# Patient Record
Sex: Male | Born: 1955 | Race: White | Hispanic: No | Marital: Single | Smoking: Never smoker
Health system: Southern US, Community
[De-identification: ages and names within clinical notes are randomized; demographics above are authoritative.]

## PROBLEM LIST (undated history)

## (undated) DIAGNOSIS — E119 Type 2 diabetes mellitus without complications: Secondary | ICD-10-CM

## (undated) DIAGNOSIS — E785 Hyperlipidemia, unspecified: Secondary | ICD-10-CM

## (undated) DIAGNOSIS — I10 Essential (primary) hypertension: Secondary | ICD-10-CM

## (undated) HISTORY — PX: COLONOSCOPY: SHX174

## (undated) HISTORY — PX: TONSILLECTOMY: SUR1361

---

## 2012-04-24 ENCOUNTER — Encounter (HOSPITAL_COMMUNITY): Payer: Self-pay | Admitting: *Deleted

## 2012-04-24 ENCOUNTER — Emergency Department (HOSPITAL_COMMUNITY): Payer: Self-pay

## 2012-04-24 ENCOUNTER — Inpatient Hospital Stay (HOSPITAL_COMMUNITY)
Admission: EM | Admit: 2012-04-24 | Discharge: 2012-04-25 | DRG: 552 | Disposition: A | Discharge status: Home / Self Care | Payer: Self-pay | Attending: General Surgery | Admitting: General Surgery

## 2012-04-24 DIAGNOSIS — S2241XA Multiple fractures of ribs, right side, initial encounter for closed fracture: Secondary | ICD-10-CM | POA: Diagnosis present

## 2012-04-24 DIAGNOSIS — S32599A Other specified fracture of unspecified pubis, initial encounter for closed fracture: Secondary | ICD-10-CM | POA: Diagnosis present

## 2012-04-24 DIAGNOSIS — W11XXXA Fall on and from ladder, initial encounter: Secondary | ICD-10-CM | POA: Diagnosis present

## 2012-04-24 DIAGNOSIS — S3210XB Unspecified fracture of sacrum, initial encounter for open fracture: Secondary | ICD-10-CM

## 2012-04-24 DIAGNOSIS — E669 Obesity, unspecified: Secondary | ICD-10-CM | POA: Diagnosis present

## 2012-04-24 DIAGNOSIS — E785 Hyperlipidemia, unspecified: Secondary | ICD-10-CM | POA: Insufficient documentation

## 2012-04-24 DIAGNOSIS — S322XXA Fracture of coccyx, initial encounter for closed fracture: Principal | ICD-10-CM | POA: Diagnosis present

## 2012-04-24 DIAGNOSIS — S329XXA Fracture of unspecified parts of lumbosacral spine and pelvis, initial encounter for closed fracture: Secondary | ICD-10-CM

## 2012-04-24 DIAGNOSIS — S2239XA Fracture of one rib, unspecified side, initial encounter for closed fracture: Secondary | ICD-10-CM

## 2012-04-24 DIAGNOSIS — S0180XA Unspecified open wound of other part of head, initial encounter: Secondary | ICD-10-CM | POA: Diagnosis present

## 2012-04-24 DIAGNOSIS — Y93H9 Activity, other involving exterior property and land maintenance, building and construction: Secondary | ICD-10-CM

## 2012-04-24 DIAGNOSIS — S0181XA Laceration without foreign body of other part of head, initial encounter: Secondary | ICD-10-CM | POA: Diagnosis present

## 2012-04-24 DIAGNOSIS — W19XXXA Unspecified fall, initial encounter: Secondary | ICD-10-CM

## 2012-04-24 DIAGNOSIS — S32009A Unspecified fracture of unspecified lumbar vertebra, initial encounter for closed fracture: Secondary | ICD-10-CM | POA: Diagnosis present

## 2012-04-24 DIAGNOSIS — S322XXB Fracture of coccyx, initial encounter for open fracture: Secondary | ICD-10-CM

## 2012-04-24 DIAGNOSIS — S3210XA Unspecified fracture of sacrum, initial encounter for closed fracture: Principal | ICD-10-CM | POA: Diagnosis present

## 2012-04-24 DIAGNOSIS — S32509A Unspecified fracture of unspecified pubis, initial encounter for closed fracture: Secondary | ICD-10-CM | POA: Diagnosis present

## 2012-04-24 DIAGNOSIS — Y92009 Unspecified place in unspecified non-institutional (private) residence as the place of occurrence of the external cause: Secondary | ICD-10-CM

## 2012-04-24 DIAGNOSIS — S2249XA Multiple fractures of ribs, unspecified side, initial encounter for closed fracture: Secondary | ICD-10-CM | POA: Diagnosis present

## 2012-04-24 HISTORY — DX: Hyperlipidemia, unspecified: E78.5

## 2012-04-24 LAB — DIFFERENTIAL
Basophils Absolute: 0 10*3/uL (ref 0.0–0.1)
Basophils Relative: 0 % (ref 0–1)
Eosinophils Absolute: 0 10*3/uL (ref 0.0–0.7)
Eosinophils Relative: 0 % (ref 0–5)
Lymphocytes Relative: 3 % — ABNORMAL LOW (ref 12–46)
Lymphs Abs: 0.6 10*3/uL — ABNORMAL LOW (ref 0.7–4.0)
Monocytes Absolute: 1.3 10*3/uL — ABNORMAL HIGH (ref 0.1–1.0)
Monocytes Relative: 8 % (ref 3–12)
Neutro Abs: 14.1 10*3/uL — ABNORMAL HIGH (ref 1.7–7.7)
Neutrophils Relative %: 88 % — ABNORMAL HIGH (ref 43–77)

## 2012-04-24 LAB — URINALYSIS, ROUTINE W REFLEX MICROSCOPIC
Bilirubin Urine: NEGATIVE
Glucose, UA: NEGATIVE mg/dL
Ketones, ur: NEGATIVE mg/dL
Leukocytes, UA: NEGATIVE
Nitrite: NEGATIVE
Protein, ur: NEGATIVE mg/dL
Specific Gravity, Urine: 1.02 (ref 1.005–1.030)
Urobilinogen, UA: 1 mg/dL (ref 0.0–1.0)
pH: 6 (ref 5.0–8.0)

## 2012-04-24 LAB — BASIC METABOLIC PANEL
BUN: 16 mg/dL (ref 6–23)
CO2: 24 mEq/L (ref 19–32)
Calcium: 9.3 mg/dL (ref 8.4–10.5)
Chloride: 105 mEq/L (ref 96–112)
Creatinine, Ser: 0.94 mg/dL (ref 0.50–1.35)
GFR calc Af Amer: >90 mL/min (ref >90)
GFR calc non Af Amer: >90 mL/min (ref >90)
Glucose, Bld: 177 mg/dL — ABNORMAL HIGH (ref 70–99)
Potassium: 4.9 mEq/L (ref 3.5–5.1)
Sodium: 139 mEq/L (ref 135–145)

## 2012-04-24 LAB — URINE MICROSCOPIC-ADD ON

## 2012-04-24 LAB — CBC
HCT: 40.1 % (ref 39.0–52.0)
Hemoglobin: 13.7 g/dL (ref 13.0–17.0)
MCH: 28.4 pg (ref 26.0–34.0)
MCHC: 34.2 g/dL (ref 30.0–36.0)
MCV: 83 fL (ref 78.0–100.0)
Platelets: 190 10*3/uL (ref 150–400)
RBC: 4.83 MIL/uL (ref 4.22–5.81)
RDW: 13.5 % (ref 11.5–15.5)
WBC: 16 10*3/uL — ABNORMAL HIGH (ref 4.0–10.5)

## 2012-04-24 LAB — TYPE AND SCREEN
ABO/RH(D): A POS
Antibody Screen: NEGATIVE

## 2012-04-24 LAB — ABO/RH: ABO/RH(D): A POS

## 2012-04-24 MED ORDER — ONDANSETRON HCL 4 MG/2ML IJ SOLN: 4.0000 mg | Freq: Four times a day (QID) | INTRAMUSCULAR | Status: DC | PRN

## 2012-04-24 MED ORDER — ENOXAPARIN SODIUM 30 MG/0.3ML ~~LOC~~ SOLN
30.0000 mg | Freq: Two times a day (BID) | SUBCUTANEOUS | Status: DC
  Administered 2012-04-24 – 2012-04-25 (×3): 30 mg via SUBCUTANEOUS
  Filled 2012-04-24 (×4): qty 0.3

## 2012-04-24 MED ORDER — MORPHINE SULFATE 4 MG/ML IJ SOLN
4.0000 mg | Freq: Once | INTRAMUSCULAR | Status: AC
  Administered 2012-04-24: 4 mg via INTRAVENOUS
  Filled 2012-04-24: qty 1

## 2012-04-24 MED ORDER — ONDANSETRON HCL 4 MG PO TABS: 4.0000 mg | ORAL_TABLET | Freq: Four times a day (QID) | ORAL | Status: DC | PRN

## 2012-04-24 MED ORDER — MORPHINE SULFATE 4 MG/ML IJ SOLN: 4.0000 mg | INTRAMUSCULAR | Status: DC | PRN

## 2012-04-24 MED ORDER — PRAVASTATIN SODIUM 20 MG PO TABS
20.0000 mg | ORAL_TABLET | Freq: Every day | ORAL | Status: DC
  Administered 2012-04-24: 20 mg via ORAL
  Filled 2012-04-24 (×2): qty 1

## 2012-04-24 MED ORDER — OXYCODONE HCL 5 MG PO TABS
5.0000 mg | ORAL_TABLET | ORAL | Status: DC | PRN
  Administered 2012-04-25: 5 mg via ORAL
  Filled 2012-04-24: qty 1

## 2012-04-24 MED ORDER — OXYCODONE HCL 5 MG PO TABS
2.5000 mg | ORAL_TABLET | ORAL | Status: DC | PRN
  Filled 2012-04-24: qty 1

## 2012-04-24 MED ORDER — IOHEXOL 300 MG/ML  SOLN
70.0000 mL | Freq: Once | INTRAMUSCULAR | Status: AC | PRN
  Administered 2012-04-24: 70 mL via INTRAVENOUS

## 2012-04-24 MED ORDER — GEMFIBROZIL 600 MG PO TABS
600.0000 mg | ORAL_TABLET | Freq: Two times a day (BID) | ORAL | Status: DC
  Administered 2012-04-24 – 2012-04-25 (×3): 600 mg via ORAL
  Filled 2012-04-24 (×5): qty 1

## 2012-04-24 MED ORDER — IOHEXOL 300 MG/ML  SOLN
80.0000 mL | Freq: Once | INTRAMUSCULAR | Status: AC | PRN
  Administered 2012-04-24: 80 mL via INTRAVENOUS

## 2012-04-24 MED ORDER — SODIUM CHLORIDE 0.45 % IV SOLN
INTRAVENOUS | Status: DC
  Administered 2012-04-24 – 2012-04-25 (×2): via INTRAVENOUS

## 2012-04-24 MED ORDER — SIMVASTATIN 10 MG PO TABS: 10.0000 mg | ORAL_TABLET | Freq: Every day | ORAL | Status: DC

## 2012-04-24 MED ORDER — POLYETHYLENE GLYCOL 3350 17 G PO PACK
17.0000 g | PACK | Freq: Every day | ORAL | Status: DC
  Filled 2012-04-24 (×2): qty 1

## 2012-04-24 MED ORDER — DOCUSATE SODIUM 100 MG PO CAPS
100.0000 mg | ORAL_CAPSULE | Freq: Two times a day (BID) | ORAL | Status: DC
  Administered 2012-04-24 – 2012-04-25 (×2): 100 mg via ORAL
  Filled 2012-04-24 (×2): qty 1

## 2012-04-24 MED ORDER — OXYCODONE HCL 5 MG PO TABS
10.0000 mg | ORAL_TABLET | ORAL | Status: DC | PRN
  Administered 2012-04-24 – 2012-04-25 (×4): 10 mg via ORAL
  Administered 2012-04-25: 5 mg via ORAL
  Filled 2012-04-24 (×4): qty 2

## 2012-04-24 NOTE — Consult Note (Signed)
Reason for Consult: Pelvic ring fracture on the right. Referring Physician: Trauma  Charles Brandt is an 56 y.o. male.  HPI: Patient is a 56 year old gentleman who was cutting tree limbs while standing on a ladder. He states it will and optimal off the ladder landed on his right hip. Patient denies any pain in the head and neck or lower or upper extremities complains of right rib pain and right pelvic pain.  Past Medical History  Diagnosis Date  . Hyperlipidemia     History reviewed. No pertinent past surgical history.  History reviewed. No pertinent family history.  Social History:  does not have a smoking history on file. He does not have any smokeless tobacco history on file. He reports that he drinks alcohol. He reports that he does not use illicit drugs.  Allergies: No Known Allergies  Medications: I have reviewed the patient's current medications.  Results for orders placed during the hospital encounter of 04/24/12 (from the past 48 hour(s))  CBC     Status: Abnormal   Collection Time   04/24/12  2:01 AM      Component Value Range Comment   WBC 16.0 (*) 4.0 - 10.5 (K/uL)    RBC 4.83  4.22 - 5.81 (MIL/uL)    Hemoglobin 13.7  13.0 - 17.0 (g/dL)    HCT 16.1  09.6 - 04.5 (%)    MCV 83.0  78.0 - 100.0 (fL)    MCH 28.4  26.0 - 34.0 (pg)    MCHC 34.2  30.0 - 36.0 (g/dL)    RDW 40.9  81.1 - 91.4 (%)    Platelets 190  150 - 400 (K/uL)   DIFFERENTIAL     Status: Abnormal   Collection Time   04/24/12  2:01 AM      Component Value Range Comment   Neutrophils Relative 88 (*) 43 - 77 (%)    Neutro Abs 14.1 (*) 1.7 - 7.7 (K/uL)    Lymphocytes Relative 3 (*) 12 - 46 (%)    Lymphs Abs 0.6 (*) 0.7 - 4.0 (K/uL)    Monocytes Relative 8  3 - 12 (%)    Monocytes Absolute 1.3 (*) 0.1 - 1.0 (K/uL)    Eosinophils Relative 0  0 - 5 (%)    Eosinophils Absolute 0.0  0.0 - 0.7 (K/uL)    Basophils Relative 0  0 - 1 (%)    Basophils Absolute 0.0  0.0 - 0.1 (K/uL)   BASIC METABOLIC PANEL     Status:  Abnormal   Collection Time   04/24/12  2:01 AM      Component Value Range Comment   Sodium 139  135 - 145 (mEq/L)    Potassium 4.9  3.5 - 5.1 (mEq/L) HEMOLYSIS AT THIS LEVEL MAY AFFECT RESULT   Chloride 105  96 - 112 (mEq/L)    CO2 24  19 - 32 (mEq/L)    Glucose, Bld 177 (*) 70 - 99 (mg/dL)    BUN 16  6 - 23 (mg/dL)    Creatinine, Ser 7.82  0.50 - 1.35 (mg/dL)    Calcium 9.3  8.4 - 10.5 (mg/dL)    GFR calc non Af Amer >90  >90 (mL/min)    GFR calc Af Amer >90  >90 (mL/min)   TYPE AND SCREEN     Status: Normal   Collection Time   04/24/12  4:00 AM      Component Value Range Comment   ABO/RH(D) A POS  Antibody Screen NEG      Sample Expiration 04/27/2012     ABO/RH     Status: Normal   Collection Time   04/24/12  4:00 AM      Component Value Range Comment   ABO/RH(D) A POS     URINALYSIS, ROUTINE W REFLEX MICROSCOPIC     Status: Abnormal   Collection Time   04/24/12  8:45 AM      Component Value Range Comment   Color, Urine YELLOW  YELLOW     APPearance CLEAR  CLEAR     Specific Gravity, Urine 1.020  1.005 - 1.030     pH 6.0  5.0 - 8.0     Glucose, UA NEGATIVE  NEGATIVE (mg/dL)    Hgb urine dipstick TRACE (*) NEGATIVE     Bilirubin Urine NEGATIVE  NEGATIVE     Ketones, ur NEGATIVE  NEGATIVE (mg/dL)    Protein, ur NEGATIVE  NEGATIVE (mg/dL)    Urobilinogen, UA 1.0  0.0 - 1.0 (mg/dL)    Nitrite NEGATIVE  NEGATIVE     Leukocytes, UA NEGATIVE  NEGATIVE    URINE MICROSCOPIC-ADD ON     Status: Normal   Collection Time   04/24/12  8:45 AM      Component Value Range Comment   Squamous Epithelial / LPF RARE  RARE     WBC, UA 0-2  <3 (WBC/hpf)    RBC / HPF 0-2  <3 (RBC/hpf)    Bacteria, UA RARE  RARE      Dg Hip Complete Right  04/24/2012  *RADIOLOGY REPORT*  Clinical Data: Status post fall from ladder; right hip pain.  RIGHT HIP - COMPLETE 2+ VIEW  Comparison: None.  Findings: There are comminuted fractures involving the right superior and inferior pubic rami.  No additional  fractures are identified.  Both femoral heads are seated normally within their respective acetabula.  The proximal right femur appears intact. Mild degenerative change is noted at the acetabula bilaterally. The sacroiliac joints are unremarkable in appearance.  The visualized bowel gas pattern is grossly unremarkable in appearance.  Scattered phleboliths are noted within the pelvis.  IMPRESSION: Comminuted fractures involving the right superior and inferior pubic rami, demonstrating mild displacement.  Original Report Authenticated By: Tonia Ghent, M.D.   Ct Head Wo Contrast  04/24/2012  *RADIOLOGY REPORT*  Clinical Data:  Status post fall from ladder; mid forehead laceration.  Concern for head or cervical spine injury.  CT HEAD WITHOUT CONTRAST AND CT CERVICAL SPINE WITHOUT CONTRAST  Technique:  Multidetector CT imaging of the head and cervical spine was performed following the standard protocol without intravenous contrast.  Multiplanar CT image reconstructions of the cervical spine were also generated.  Comparison: None  CT HEAD  Findings: There is no evidence of acute infarction, mass lesion, or intra- or extra-axial hemorrhage on CT.  Vague foci of increased attenuation near the skull base are thought to reflect beam hardening artifact rather than subarachnoid hemorrhage.  The posterior fossa, including the cerebellum, brainstem and fourth ventricle, is within normal limits.  The third and lateral ventricles, and basal ganglia are unremarkable in appearance.  The cerebral hemispheres are symmetric in appearance, with normal gray- white differentiation.  No mass effect or midline shift is seen.  There is no evidence of fracture; visualized osseous structures are unremarkable in appearance.  The orbits are within normal limits. The paranasal sinuses and mastoid air cells are well-aerated.  A soft tissue laceration is noted overlying the  frontal calvarium.  IMPRESSION:  1.  No evidence of traumatic  intracranial injury or fracture. 2.  Soft tissue laceration overlying the frontal calvarium.  CT CERVICAL SPINE  Findings: There is no evidence of fracture or subluxation. Vertebral bodies demonstrate normal height and alignment. Intervertebral disc spaces are preserved.  Prevertebral soft tissues are within normal limits.  Small anterior and posterior disc osteophyte complexes are noted at the lower cervical spine. Mild facet disease is seen.  The thyroid gland is unremarkable in appearance.  The visualized lung apices are clear.  No significant soft tissue abnormalities are seen.  IMPRESSION: No evidence of fracture or subluxation along the cervical spine.  Original Report Authenticated By: Tonia Ghent, M.D.   Ct Chest W Contrast  04/24/2012  *RADIOLOGY REPORT*  Clinical Data: Status post fall from ladder; concern for chest injury.  CT CHEST WITH CONTRAST  Technique:  Multidetector CT imaging of the chest was performed following the standard protocol during bolus administration of intravenous contrast.  Contrast: 80mL OMNIPAQUE IOHEXOL 300 MG/ML  SOLN  Comparison: None.  Findings: There is no evidence for pulmonary parenchymal contusion. Mild airspace opacity at the right lung apex and right lung base likely reflect atelectasis.  Minimal left-sided atelectasis is also seen.  No pleural effusion or pneumothorax is identified.  The mediastinum is unremarkable in appearance.  There is no evidence of venous hemorrhage.  No mediastinal lymphadenopathy is seen.  No pericardial effusion is identified.  The great vessels are unremarkable in appearance.  No axillary lymphadenopathy is seen.  There is no evidence of soft tissue injury along the chest wall.  There is a 1.4 cm hypodensity at the right hepatic lobe; this is too small to further characterize, but may reflect a small cyst. The visualized portions of the liver and the spleen are unremarkable in appearance.  The gallbladder is within normal limits.  The  visualized portions of the pancreas, adrenal glands and both kidneys are normal in appearance.  No acute osseous abnormalities are identified.  IMPRESSION:  1.  No evidence for traumatic injury to the chest. 2.  Mild bilateral atelectasis noted. 3.  Likely small hepatic cyst noted.  Original Report Authenticated By: Tonia Ghent, M.D.   Ct Cervical Spine Wo Contrast  04/24/2012  *RADIOLOGY REPORT*  Clinical Data:  Status post fall from ladder; mid forehead laceration.  Concern for head or cervical spine injury.  CT HEAD WITHOUT CONTRAST AND CT CERVICAL SPINE WITHOUT CONTRAST  Technique:  Multidetector CT imaging of the head and cervical spine was performed following the standard protocol without intravenous contrast.  Multiplanar CT image reconstructions of the cervical spine were also generated.  Comparison: None  CT HEAD  Findings: There is no evidence of acute infarction, mass lesion, or intra- or extra-axial hemorrhage on CT.  Vague foci of increased attenuation near the skull base are thought to reflect beam hardening artifact rather than subarachnoid hemorrhage.  The posterior fossa, including the cerebellum, brainstem and fourth ventricle, is within normal limits.  The third and lateral ventricles, and basal ganglia are unremarkable in appearance.  The cerebral hemispheres are symmetric in appearance, with normal gray- white differentiation.  No mass effect or midline shift is seen.  There is no evidence of fracture; visualized osseous structures are unremarkable in appearance.  The orbits are within normal limits. The paranasal sinuses and mastoid air cells are well-aerated.  A soft tissue laceration is noted overlying the frontal calvarium.  IMPRESSION:  1.  No evidence of  traumatic intracranial injury or fracture. 2.  Soft tissue laceration overlying the frontal calvarium.  CT CERVICAL SPINE  Findings: There is no evidence of fracture or subluxation. Vertebral bodies demonstrate normal height and  alignment. Intervertebral disc spaces are preserved.  Prevertebral soft tissues are within normal limits.  Small anterior and posterior disc osteophyte complexes are noted at the lower cervical spine. Mild facet disease is seen.  The thyroid gland is unremarkable in appearance.  The visualized lung apices are clear.  No significant soft tissue abnormalities are seen.  IMPRESSION: No evidence of fracture or subluxation along the cervical spine.  Original Report Authenticated By: Tonia Ghent, M.D.   Ct Abdomen Pelvis W Contrast  04/24/2012  *RADIOLOGY REPORT*  Clinical Data: Status post fall off ladder; right pubic rami fractures.  Assess for pelvic injury.  CT ABDOMEN AND PELVIS WITH CONTRAST  Technique:  Multidetector CT imaging of the abdomen and pelvis was performed following the standard protocol during bolus administration of intravenous contrast.  Contrast: 70mL OMNIPAQUE IOHEXOL 300 MG/ML  SOLN  Comparison: Pelvis radiograph performed earlier today at 03:31 a.m.  Findings: Mild bibasilar atelectasis or scarring is noted.  Scattered foci of hypoattenuation within the liver, measuring up to 1.2 cm, are nonspecific but may reflect small cysts.  The liver is otherwise unremarkable in appearance.  The spleen is normal in appearance.  The gallbladder is within normal limits.  The pancreas and adrenal glands are unremarkable.  Contrast is noted within the renal calyces.  This limits evaluation for renal stones.  Nonspecific perinephric stranding is noted bilaterally.  The kidneys are otherwise unremarkable in appearance. No hydronephrosis is seen.  No free fluid is identified.  The small bowel is unremarkable in appearance.  The stomach is within normal limits.  No acute vascular abnormalities are seen.  The appendix is normal in caliber, without evidence for appendicitis.  Diffuse diverticulosis is noted along the distal descending and proximal sigmoid colon, without evidence of diverticulitis.  A small  umbilical hernia is noted, containing only fat, and demonstrating mild soft tissue inflammation.  The bladder is mildly distended and contains contrast; the bladder appears intact, without definite evidence of bladder injury, though there is a small amount of blood and soft tissue stranding noted along the right side of the bladder, extending about the right superior pubic ramus fracture.  Blood is also noted tracking along the right pelvic sidewall and anterior to the sacrum.  The prostate remains normal in size.  No inguinal lymphadenopathy is seen.  There is a minimally displaced slightly comminuted fracture through the right sacral ala, involving the inferior neural foramina at the levels of S2, S3 and S4. There are also comminuted fractures involving the right superior and inferior pubic rami.  In addition, there is a minimally displaced fracture involving the right transverse process of L5.  No additional fractures are identified.  IMPRESSION:  1.  Minimally displaced slightly comminuted fracture through the right sacral ala, involving the inferior neural foramina at the levels of S2, S3 and S4. 2.  Minimally displaced fracture involving the right transverse process of L5; comminuted fractures involving the right superior and inferior pubic rami. 3.  Small amount of blood and soft tissue stranding noted along the right side of the bladder, extending about the right superior pubic ramus fracture.  Blood also noted tracking along the right pelvic sidewall and anterior to the sacrum.  No definite evidence of bladder injury. 4.  Small umbilical hernia, containing only fat,  and demonstrating mild soft tissue inflammation. 5.  Diffuse diverticulosis along the distal descending and proximal sigmoid colon, without evidence of diverticulitis. 6.  Likely small hepatic cysts noted. 7.  Mild bibasilar atelectasis or scarring noted.  These results were called by telephone on 04/24/2012  at  06:32 a.m. to  [facility] PA,  who verbally acknowledged these results.  Original Report Authenticated By: Tonia Ghent, M.D.    Review of Systems  All other systems reviewed and are negative.   Blood pressure 127/72, pulse 94, temperature 97.8 F (36.6 C), temperature source Oral, resp. rate 20, SpO2 99.00%. Physical Exam On examination patient's right lower extremity is neurovascularly intact. He has good pulses. He does have pain with range of motion of the hip. Review of the radiographs and CT scan shows an inferior and superior pubic rami fracture on the right with a nondisplaced right sacral fracture through the foramen. Assessment/Plan: Assessment: Right inferior superior pubic rami fracture and sacral foraminal fracture.  Plan: The fracture pattern is stable patient should be able to progress with physical therapy touchdown weightbearing on the right discharge when safe with therapy.  Pansy Ostrovsky V 04/24/2012, 10:15 AM

## 2012-04-24 NOTE — ED Notes (Signed)
Patient transported to CT 

## 2012-04-24 NOTE — Progress Notes (Signed)
UR complete 

## 2012-04-24 NOTE — ED Notes (Signed)
Per EMS - pt was outside yesterday evening and while on a ladder fell approx 18ft to the ground - pt laid outside approx 4hrs until he was found by his girlfriend - pt c/o rt rib pain and rt hip - denies LOC. Pt received 6mg  Morphine by EMS en route.

## 2012-04-24 NOTE — Evaluation (Signed)
Occupational Therapy Evaluation Patient Details Name: Arben Packman MRN: [id number] DOB: August 11, 1956 Today's Date: 04/24/2012 Time: 4098-1191 OT Time Calculation (min): 37 min  OT Assessment / Plan / Recommendation Clinical Impression  Pt. presents s/p fall from ladder and with pelvic fracture and rt. side rib pain. Pt. will benefit from skilled OT to increase functional independence to min assist-supervision level at D/C home with assist from girlfriend and friends.    OT Assessment  Patient needs continued OT Services    Follow Up Recommendations  Home health OT;Supervision - Intermittent    Barriers to Discharge None    Equipment Recommendations  Rolling walker with 5" wheels;3 in 1 bedside comode       Frequency  Min 2X/week    Precautions / Restrictions Precautions Precautions: Fall Restrictions Weight Bearing Restrictions: Yes RLE Weight Bearing: Touchdown weight bearing   Pertinent Vitals/Pain 10/10 rt. Rib pain with mobility    ADL  Eating/Feeding: Simulated;Independent Where Assessed - Eating/Feeding: Edge of bed Grooming: Wash/dry face;Performed;Set up Where Assessed - Grooming: Unsupported sitting Upper Body Bathing: Simulated;Set up Where Assessed - Upper Body Bathing: Sitting, bed Lower Body Bathing: Simulated;Maximal assistance Where Assessed - Lower Body Bathing: Sitting, bed Upper Body Dressing: Minimal assistance;Performed Where Assessed - Upper Body Dressing: Sitting, bed Lower Body Dressing: Simulated;Maximal assistance Where Assessed - Lower Body Dressing: Sit to stand from bed Toilet Transfer: Simulated;+2 Total assistance;Comment for patient % (pt=50%) Toilet Transfer Method: Stand pivot Toilet Transfer Equipment: Other (comment) Nurse, children's) Toileting - Clothing Manipulation: Simulated;Minimal assistance Where Assessed - Toileting Clothing Manipulation: Sit to stand from 3-in-1 or toilet Toileting - Hygiene: Simulated;Minimal assistance Where  Assessed - Toileting Hygiene: Sit to stand from 3-in-1 or toilet Tub/Shower Transfer: Not assessed Tub/Shower Transfer Method: Not assessed Equipment Used: Rolling walker Ambulation Related to ADLs: NA ADL Comments: Pt. educated on techniques for completing LB ADLs with use of AE due to pt. unable to bend forward due to fractures. Pt. also educated on techniques for completing ADLs with primary use of left UE due to rt. rib pain with use of rt. UW.    OT Diagnosis: Acute pain  OT Problem List: Decreased activity tolerance;Impaired balance (sitting and/or standing);Decreased safety awareness;Decreased knowledge of use of DME or AE;Pain OT Treatment Interventions: Self-care/ADL training;DME and/or AE instruction;Therapeutic activities;Patient/family education;Balance training   OT Goals Acute Rehab OT Goals OT Goal Formulation: With patient Time For Goal Achievement: 05/01/12 Potential to Achieve Goals: Good ADL Goals Pt Will Perform Grooming: with set-up;with supervision;Standing at sink ADL Goal: Grooming - Progress: Goal set today Pt Will Perform Upper Body Dressing: with set-up;Sitting, bed ADL Goal: Upper Body Dressing - Progress: Goal set today Pt Will Perform Lower Body Dressing: with set-up;with supervision;with adaptive equipment;Sit to stand from bed ADL Goal: Lower Body Dressing - Progress: Goal set today Pt Will Transfer to Toilet: with min assist;with DME;Stand pivot transfer;3-in-1 ADL Goal: Toilet Transfer - Progress: Goal set today Pt Will Perform Toileting - Hygiene: with set-up;with supervision;Sit to stand from 3-in-1/toilet ADL Goal: Toileting - Hygiene - Progress: Goal set today Additional ADL Goal #1: Pt. will complete bed mobility with min assist in prep for BADLs ADL Goal: Additional Goal #1 - Progress: Goal set today  Visit Information  Last OT Received On: 04/24/12 Assistance Needed: +2 PT/OT Co-Evaluation/Treatment: Yes    Subjective Data  Subjective: "I  am up for moving" Patient Stated Goal: "Get home"   Prior Functioning  Home Living Lives With: Significant other Available Help at Discharge: Family;Friend(s)  Type of Home: House Home Access: Stairs to enter [employer] of Steps: 3 Entrance Stairs-Rails:  (Will install rail) Home Layout: One level Bathroom Shower/Tub: Forensic scientist: Standard Bathroom Accessibility: Yes How Accessible: Accessible via walker Home Adaptive Equipment: None Prior Function Level of Independence: Independent Able to Take Stairs?: Reciprically Driving: Yes Vocation: Full time employment          Mobility Bed Mobility Bed Mobility: Supine to Sit Supine to Sit: 1: +2 Total assist;With rails Supine to Sit: Patient Percentage: 20% Details for Bed Mobility Assistance: pt. with use of left UE to pull on rail and assist for bil LE and trunk elevation off bed with use of pad Transfers Transfers: Sit to Stand;Stand to Sit Sit to Stand: 1: +2 Total assist;From bed;With upper extremity assist Sit to Stand: Patient Percentage: 50% Stand to Sit: 2: Max assist;To bed Details for Transfer Assistance: Mod verbal cues for technique and for safety of hand placement and with use of RW         End of Session OT - End of Session Equipment Utilized During Treatment: Gait belt Activity Tolerance: Patient limited by pain Patient left: in bed;with call bell/phone within reach;with family/visitor present Nurse Communication: Mobility status   Findlay Dagher, OTR/L Pager [phone] 04/24/2012, 4:12 PM

## 2012-04-24 NOTE — ED Notes (Signed)
Pt reports was attempting to cut down a branch yesterday evening when he fell approx 22ft from his ladder - pt sustained laceration to mid forehead, bleeding controlled and dressing intact - pt c/o rt rib pain, worse w/ inspiration and rt hip pain, worse w/ movement. Pt states he was unable to ambulate s/p d/t pain and was found hours later by girlfriend. Pt denies LOC is A&Ox4 w/o any obvious neuro deficits on assessment.

## 2012-04-24 NOTE — ED Notes (Signed)
Trauma Surgeon in to see the patient.

## 2012-04-24 NOTE — ED Provider Notes (Signed)
Medical screening examination/treatment/procedure(s) were conducted as a shared visit with non-physician practitioner(s) and myself.  I personally evaluated the patient during the encounter  I have discussed patients injuries with trauma surgery, Dr. Donell Beers, she will see and evaluate in the ED.  Pt is maintaining his blood pressure, pain improved after morphine.    Charles Chick, MD 04/24/12 [phone]

## 2012-04-24 NOTE — H&P (Signed)
Patient examined and evaluated together and I agree with the assessment and plan. I spoke to Dr. Lajoyce Corners as well.  Violeta Gelinas, MD, MPH, FACS Pager: [phone]  04/24/2012 10:50 AM

## 2012-04-24 NOTE — Progress Notes (Signed)
Abbreviated Physical Therapy Note (full note to follow)  PT eval complete. Co-eval with OT. Pt limited primarily by pain (RN aware and pt was premedicated) but was able to perform supine->sit EOB with +2total20% today. Sat EOB 5-10 minutes. Sit->stand +2totalpt50% with cues for TDWB and sequencing. Pt sat with modA and sit->supine +2total0%. DME will depend his progress but at this point he will definitely need a RW. Plan for HHPT at this point unless he does not progress as expected. Full eval note to follow.  Ivonne Andrew PT, DPT Pager: [phone]

## 2012-04-24 NOTE — ED Provider Notes (Signed)
History     CSN: [id number]  Arrival date & time 04/24/12  0140   First MD Initiated Contact with Patient 04/24/12 [phone]      Chief Complaint  Patient presents with  . Fall    HPI  History provided by the patient. Level V applies to the urgent need to intervene. The patient is a healthy 56 year old male who presents with injuries after a fall. Patient reports being up on a ladder trimming a tree when a branch knocked him off the ladder approximately 6-7 feet. Patient states that he was in a lot of pain after the fall especially around the right ribs and hip area. Patient denies loss of consciousness. He did attempt to stand but was unable to bear weight and had much difficulty moving. Patient was able to crawl towards his front porch and was calling for help. Patient states that he remained on the ground for several hours until his girlfriend came to his home and found him. EMS was called and patient was put on a long spine board and c-collar with pain medications administered in route. At this time patient reports improvement of pain. He does also have a laceration with bleeding from his scalp. He denies any numbness or weakness in his feet. Patient denies any shortness of breath.    Past Medical History  Diagnosis Date  . Hyperlipidemia     History reviewed. No pertinent past surgical history.  History reviewed. No pertinent family history.  History  Substance Use Topics  . Smoking status: Not on file  . Smokeless tobacco: Not on file  . Alcohol Use:       Review of Systems Level V applies to the urgent need to intervene    Allergies  Review of patient's allergies indicates no known allergies.  Home Medications   Current Outpatient Rx  Name Route Sig Dispense Refill  . GEMFIBROZIL 600 MG PO TABS Oral Take 600 mg by mouth 2 (two) times daily before a meal.    . PRAVASTATIN SODIUM 20 MG PO TABS Oral Take 20 mg by mouth at bedtime.      BP 138/67  Pulse 67   Temp(Src) 98.8 F (37.1 C) (Oral)  Resp 22  SpO2 92%  Physical Exam  Nursing note and vitals reviewed. Constitutional: He is oriented to person, place, and time. He appears well-developed and well-nourished. No distress.  HENT:  Head: Normocephalic.       Laceration to the center forehead. No active bleeding. No Battle sign or raccoon eyes.  Eyes: Conjunctivae and EOM are normal. Pupils are equal, round, and reactive to light.  Neck: No tracheal deviation present.       C. collar in place.  Cardiovascular: Normal rate and regular rhythm.   Pulmonary/Chest: Effort normal and breath sounds normal. No respiratory distress. He has no wheezes. He has no rales. He exhibits tenderness.       Tenderness over right lateral and anterior lower ribs. No step-offs. Lungs sounds normal.  Abdominal: Soft. He exhibits no distension. There is no tenderness. There is no rebound and no guarding.       Soft reducible umbilical hernia  Musculoskeletal:       Pain over right hip. No shortening or rotation of the right lower leg. Normal distal sensations and dorsal pedal pulses. His pain with passive range of motion. Upper extremities normal  Neurological: He is alert and oriented to person, place, and time.  Skin: Skin is warm.  Psychiatric:  He has a normal mood and affect. His behavior is normal.    ED Course  Procedures   LACERATION REPAIR Performed by: Angus Seller Authorized by: Angus Seller Consent: Verbal consent obtained. Risks and benefits: risks, benefits and alternatives were discussed Consent given by: patient Patient identity confirmed: provided demographic data Prepped and Draped in normal sterile fashion Wound explored  Laceration Location: forehead  Laceration Length: 4 cm  No Foreign Bodies seen or palpated  Anesthesia: None   Irrigation method: syringe Amount of cleaning: standard  Skin closure: Dermabond    Patient tolerance: Patient tolerated the procedure well  with no immediate complications.    Results for orders placed during the hospital encounter of 04/24/12  CBC      Component Value Range   WBC 16.0 (*) 4.0 - 10.5 (K/uL)   RBC 4.83  4.22 - 5.81 (MIL/uL)   Hemoglobin 13.7  13.0 - 17.0 (g/dL)   HCT 40.9  81.1 - 91.4 (%)   MCV 83.0  78.0 - 100.0 (fL)   MCH 28.4  26.0 - 34.0 (pg)   MCHC 34.2  30.0 - 36.0 (g/dL)   RDW 78.2  95.6 - 21.3 (%)   Platelets 190  150 - 400 (K/uL)  DIFFERENTIAL      Component Value Range   Neutrophils Relative 88 (*) 43 - 77 (%)   Neutro Abs 14.1 (*) 1.7 - 7.7 (K/uL)   Lymphocytes Relative 3 (*) 12 - 46 (%)   Lymphs Abs 0.6 (*) 0.7 - 4.0 (K/uL)   Monocytes Relative 8  3 - 12 (%)   Monocytes Absolute 1.3 (*) 0.1 - 1.0 (K/uL)   Eosinophils Relative 0  0 - 5 (%)   Eosinophils Absolute 0.0  0.0 - 0.7 (K/uL)   Basophils Relative 0  0 - 1 (%)   Basophils Absolute 0.0  0.0 - 0.1 (K/uL)  BASIC METABOLIC PANEL      Component Value Range   Sodium 139  135 - 145 (mEq/L)   Potassium 4.9  3.5 - 5.1 (mEq/L)   Chloride 105  96 - 112 (mEq/L)   CO2 24  19 - 32 (mEq/L)   Glucose, Bld 177 (*) 70 - 99 (mg/dL)   BUN 16  6 - 23 (mg/dL)   Creatinine, Ser 0.86  0.50 - 1.35 (mg/dL)   Calcium 9.3  8.4 - 57.8 (mg/dL)   GFR calc non Af Amer >90  >90 (mL/min)   GFR calc Af Amer >90  >90 (mL/min)  TYPE AND SCREEN      Component Value Range   ABO/RH(D) A POS     Antibody Screen NEG     Sample Expiration 04/27/2012    ABO/RH      Component Value Range   ABO/RH(D) A POS        Dg Hip Complete Right  04/24/2012  *RADIOLOGY REPORT*  Clinical Data: Status post fall from ladder; right hip pain.  RIGHT HIP - COMPLETE 2+ VIEW  Comparison: None.  Findings: There are comminuted fractures involving the right superior and inferior pubic rami.  No additional fractures are identified.  Both femoral heads are seated normally within their respective acetabula.  The proximal right femur appears intact. Mild degenerative change is noted at the  acetabula bilaterally. The sacroiliac joints are unremarkable in appearance.  The visualized bowel gas pattern is grossly unremarkable in appearance.  Scattered phleboliths are noted within the pelvis.  IMPRESSION: Comminuted fractures involving the right superior and inferior pubic  rami, demonstrating mild displacement.  Original Report Authenticated By: Tonia Ghent, M.D.   Ct Head Wo Contrast  04/24/2012  *RADIOLOGY REPORT*  Clinical Data:  Status post fall from ladder; mid forehead laceration.  Concern for head or cervical spine injury.  CT HEAD WITHOUT CONTRAST AND CT CERVICAL SPINE WITHOUT CONTRAST  Technique:  Multidetector CT imaging of the head and cervical spine was performed following the standard protocol without intravenous contrast.  Multiplanar CT image reconstructions of the cervical spine were also generated.  Comparison: None  CT HEAD  Findings: There is no evidence of acute infarction, mass lesion, or intra- or extra-axial hemorrhage on CT.  Vague foci of increased attenuation near the skull base are thought to reflect beam hardening artifact rather than subarachnoid hemorrhage.  The posterior fossa, including the cerebellum, brainstem and fourth ventricle, is within normal limits.  The third and lateral ventricles, and basal ganglia are unremarkable in appearance.  The cerebral hemispheres are symmetric in appearance, with normal gray- white differentiation.  No mass effect or midline shift is seen.  There is no evidence of fracture; visualized osseous structures are unremarkable in appearance.  The orbits are within normal limits. The paranasal sinuses and mastoid air cells are well-aerated.  A soft tissue laceration is noted overlying the frontal calvarium.  IMPRESSION:  1.  No evidence of traumatic intracranial injury or fracture. 2.  Soft tissue laceration overlying the frontal calvarium.  CT CERVICAL SPINE  Findings: There is no evidence of fracture or subluxation. Vertebral bodies  demonstrate normal height and alignment. Intervertebral disc spaces are preserved.  Prevertebral soft tissues are within normal limits.  Small anterior and posterior disc osteophyte complexes are noted at the lower cervical spine. Mild facet disease is seen.  The thyroid gland is unremarkable in appearance.  The visualized lung apices are clear.  No significant soft tissue abnormalities are seen.  IMPRESSION: No evidence of fracture or subluxation along the cervical spine.  Original Report Authenticated By: Tonia Ghent, M.D.   Ct Chest W Contrast  04/24/2012  *RADIOLOGY REPORT*  Clinical Data: Status post fall from ladder; concern for chest injury.  CT CHEST WITH CONTRAST  Technique:  Multidetector CT imaging of the chest was performed following the standard protocol during bolus administration of intravenous contrast.  Contrast: 80mL OMNIPAQUE IOHEXOL 300 MG/ML  SOLN  Comparison: None.  Findings: There is no evidence for pulmonary parenchymal contusion. Mild airspace opacity at the right lung apex and right lung base likely reflect atelectasis.  Minimal left-sided atelectasis is also seen.  No pleural effusion or pneumothorax is identified.  The mediastinum is unremarkable in appearance.  There is no evidence of venous hemorrhage.  No mediastinal lymphadenopathy is seen.  No pericardial effusion is identified.  The great vessels are unremarkable in appearance.  No axillary lymphadenopathy is seen.  There is no evidence of soft tissue injury along the chest wall.  There is a 1.4 cm hypodensity at the right hepatic lobe; this is too small to further characterize, but may reflect a small cyst. The visualized portions of the liver and the spleen are unremarkable in appearance.  The gallbladder is within normal limits.  The visualized portions of the pancreas, adrenal glands and both kidneys are normal in appearance.  No acute osseous abnormalities are identified.  IMPRESSION:  1.  No evidence for traumatic injury  to the chest. 2.  Mild bilateral atelectasis noted. 3.  Likely small hepatic cyst noted.  Original Report Authenticated By: Tonia Ghent,  M.D.   Ct Cervical Spine Wo Contrast  04/24/2012  *RADIOLOGY REPORT*  Clinical Data:  Status post fall from ladder; mid forehead laceration.  Concern for head or cervical spine injury.  CT HEAD WITHOUT CONTRAST AND CT CERVICAL SPINE WITHOUT CONTRAST  Technique:  Multidetector CT imaging of the head and cervical spine was performed following the standard protocol without intravenous contrast.  Multiplanar CT image reconstructions of the cervical spine were also generated.  Comparison: None  CT HEAD  Findings: There is no evidence of acute infarction, mass lesion, or intra- or extra-axial hemorrhage on CT.  Vague foci of increased attenuation near the skull base are thought to reflect beam hardening artifact rather than subarachnoid hemorrhage.  The posterior fossa, including the cerebellum, brainstem and fourth ventricle, is within normal limits.  The third and lateral ventricles, and basal ganglia are unremarkable in appearance.  The cerebral hemispheres are symmetric in appearance, with normal gray- white differentiation.  No mass effect or midline shift is seen.  There is no evidence of fracture; visualized osseous structures are unremarkable in appearance.  The orbits are within normal limits. The paranasal sinuses and mastoid air cells are well-aerated.  A soft tissue laceration is noted overlying the frontal calvarium.  IMPRESSION:  1.  No evidence of traumatic intracranial injury or fracture. 2.  Soft tissue laceration overlying the frontal calvarium.  CT CERVICAL SPINE  Findings: There is no evidence of fracture or subluxation. Vertebral bodies demonstrate normal height and alignment. Intervertebral disc spaces are preserved.  Prevertebral soft tissues are within normal limits.  Small anterior and posterior disc osteophyte complexes are noted at the lower cervical spine.  Mild facet disease is seen.  The thyroid gland is unremarkable in appearance.  The visualized lung apices are clear.  No significant soft tissue abnormalities are seen.  IMPRESSION: No evidence of fracture or subluxation along the cervical spine.  Original Report Authenticated By: Tonia Ghent, M.D.     No diagnosis found.    MDM  Patient seen and evaluated. Patient in no acute distress at this time.   Patient discussed with attending physician. X-rays show pubic rami fractures. Will obtain CT scans.  Patient discussed with attending in sign out. Dr. Karma Ganja will follow CT of pelvis and a cortical consult a    Date: 04/24/2012  Rate: 68  Rhythm: normal sinus rhythm  QRS Axis: normal  Intervals: normal  ST/T Wave abnormalities: normal  Conduction Disutrbances:none  Narrative Interpretation:   Old EKG Reviewed: none available    Angus Seller, [place] 04/24/12 [phone]

## 2012-04-24 NOTE — H&P (Signed)
Charles Brandt is an 56 y.o. male.   Chief Complaint: Fall HPI: White male was trimming a tree last evening approximately 6-7 feet on a ladder when a limb hit him in the face knocking him off the ladder. He landed on his right side. No LOC, no amnesia. Was unable to walk. Crawled to his porch and lay there until he was discovered by GF several hours later. Came to ED via EMS as a non-trauma code. C/o right anterior chest pain and right pelvic pain.  Past Medical History  Diagnosis Date  . Hyperlipidemia     History reviewed. No pertinent past surgical history.  History reviewed. No pertinent family history. Social History:  does not have a smoking history on file. He does not have any smokeless tobacco history on file. He reports that he drinks alcohol. He reports that he does not use illicit drugs.  Allergies: No Known Allergies    Results for orders placed during the hospital encounter of 04/24/12 (from the past 48 hour(s))  CBC     Status: Abnormal   Collection Time   04/24/12  2:01 AM      Component Value Range Comment   WBC 16.0 (*) 4.0 - 10.5 (K/uL)    RBC 4.83  4.22 - 5.81 (MIL/uL)    Hemoglobin 13.7  13.0 - 17.0 (g/dL)    HCT 16.1  09.6 - 04.5 (%)    MCV 83.0  78.0 - 100.0 (fL)    MCH 28.4  26.0 - 34.0 (pg)    MCHC 34.2  30.0 - 36.0 (g/dL)    RDW 40.9  81.1 - 91.4 (%)    Platelets 190  150 - 400 (K/uL)   DIFFERENTIAL     Status: Abnormal   Collection Time   04/24/12  2:01 AM      Component Value Range Comment   Neutrophils Relative 88 (*) 43 - 77 (%)    Neutro Abs 14.1 (*) 1.7 - 7.7 (K/uL)    Lymphocytes Relative 3 (*) 12 - 46 (%)    Lymphs Abs 0.6 (*) 0.7 - 4.0 (K/uL)    Monocytes Relative 8  3 - 12 (%)    Monocytes Absolute 1.3 (*) 0.1 - 1.0 (K/uL)    Eosinophils Relative 0  0 - 5 (%)    Eosinophils Absolute 0.0  0.0 - 0.7 (K/uL)    Basophils Relative 0  0 - 1 (%)    Basophils Absolute 0.0  0.0 - 0.1 (K/uL)   BASIC METABOLIC PANEL     Status: Abnormal   Collection  Time   04/24/12  2:01 AM      Component Value Range Comment   Sodium 139  135 - 145 (mEq/L)    Potassium 4.9  3.5 - 5.1 (mEq/L) HEMOLYSIS AT THIS LEVEL MAY AFFECT RESULT   Chloride 105  96 - 112 (mEq/L)    CO2 24  19 - 32 (mEq/L)    Glucose, Bld 177 (*) 70 - 99 (mg/dL)    BUN 16  6 - 23 (mg/dL)    Creatinine, Ser 7.82  0.50 - 1.35 (mg/dL)    Calcium 9.3  8.4 - 10.5 (mg/dL)    GFR calc non Af Amer >90  >90 (mL/min)    GFR calc Af Amer >90  >90 (mL/min)   TYPE AND SCREEN     Status: Normal   Collection Time   04/24/12  4:00 AM      Component Value Range Comment  ABO/RH(D) A POS      Antibody Screen NEG      Sample Expiration 04/27/2012     ABO/RH     Status: Normal   Collection Time   04/24/12  4:00 AM      Component Value Range Comment   ABO/RH(D) A POS      Dg Hip Complete Right  04/24/2012  *RADIOLOGY REPORT*  Clinical Data: Status post fall from ladder; right hip pain.  RIGHT HIP - COMPLETE 2+ VIEW  Comparison: None.  Findings: There are comminuted fractures involving the right superior and inferior pubic rami.  No additional fractures are identified.  Both femoral heads are seated normally within their respective acetabula.  The proximal right femur appears intact. Mild degenerative change is noted at the acetabula bilaterally. The sacroiliac joints are unremarkable in appearance.  The visualized bowel gas pattern is grossly unremarkable in appearance.  Scattered phleboliths are noted within the pelvis.  IMPRESSION: Comminuted fractures involving the right superior and inferior pubic rami, demonstrating mild displacement.  Original Report Authenticated By: Tonia Ghent, M.D.   Ct Head Wo Contrast  04/24/2012  *RADIOLOGY REPORT*  Clinical Data:  Status post fall from ladder; mid forehead laceration.  Concern for head or cervical spine injury.  CT HEAD WITHOUT CONTRAST AND CT CERVICAL SPINE WITHOUT CONTRAST  Technique:  Multidetector CT imaging of the head and cervical spine was performed  following the standard protocol without intravenous contrast.  Multiplanar CT image reconstructions of the cervical spine were also generated.  Comparison: None  CT HEAD  Findings: There is no evidence of acute infarction, mass lesion, or intra- or extra-axial hemorrhage on CT.  Vague foci of increased attenuation near the skull base are thought to reflect beam hardening artifact rather than subarachnoid hemorrhage.  The posterior fossa, including the cerebellum, brainstem and fourth ventricle, is within normal limits.  The third and lateral ventricles, and basal ganglia are unremarkable in appearance.  The cerebral hemispheres are symmetric in appearance, with normal gray- white differentiation.  No mass effect or midline shift is seen.  There is no evidence of fracture; visualized osseous structures are unremarkable in appearance.  The orbits are within normal limits. The paranasal sinuses and mastoid air cells are well-aerated.  A soft tissue laceration is noted overlying the frontal calvarium.  IMPRESSION:  1.  No evidence of traumatic intracranial injury or fracture. 2.  Soft tissue laceration overlying the frontal calvarium.  CT CERVICAL SPINE  Findings: There is no evidence of fracture or subluxation. Vertebral bodies demonstrate normal height and alignment. Intervertebral disc spaces are preserved.  Prevertebral soft tissues are within normal limits.  Small anterior and posterior disc osteophyte complexes are noted at the lower cervical spine. Mild facet disease is seen.  The thyroid gland is unremarkable in appearance.  The visualized lung apices are clear.  No significant soft tissue abnormalities are seen.  IMPRESSION: No evidence of fracture or subluxation along the cervical spine.  Original Report Authenticated By: Tonia Ghent, M.D.   Ct Chest W Contrast  04/24/2012  *RADIOLOGY REPORT*  Clinical Data: Status post fall from ladder; concern for chest injury.  CT CHEST WITH CONTRAST  Technique:   Multidetector CT imaging of the chest was performed following the standard protocol during bolus administration of intravenous contrast.  Contrast: 80mL OMNIPAQUE IOHEXOL 300 MG/ML  SOLN  Comparison: None.  Findings: There is no evidence for pulmonary parenchymal contusion. Mild airspace opacity at the right lung apex and right lung base  likely reflect atelectasis.  Minimal left-sided atelectasis is also seen.  No pleural effusion or pneumothorax is identified.  The mediastinum is unremarkable in appearance.  There is no evidence of venous hemorrhage.  No mediastinal lymphadenopathy is seen.  No pericardial effusion is identified.  The great vessels are unremarkable in appearance.  No axillary lymphadenopathy is seen.  There is no evidence of soft tissue injury along the chest wall.  There is a 1.4 cm hypodensity at the right hepatic lobe; this is too small to further characterize, but may reflect a small cyst. The visualized portions of the liver and the spleen are unremarkable in appearance.  The gallbladder is within normal limits.  The visualized portions of the pancreas, adrenal glands and both kidneys are normal in appearance.  No acute osseous abnormalities are identified.  IMPRESSION:  1.  No evidence for traumatic injury to the chest. 2.  Mild bilateral atelectasis noted. 3.  Likely small hepatic cyst noted.  Original Report Authenticated By: Tonia Ghent, M.D.   Ct Abdomen Pelvis W Contrast  04/24/2012  *RADIOLOGY REPORT*  Clinical Data: Status post fall off ladder; right pubic rami fractures.  Assess for pelvic injury.  CT ABDOMEN AND PELVIS WITH CONTRAST  Technique:  Multidetector CT imaging of the abdomen and pelvis was performed following the standard protocol during bolus administration of intravenous contrast.  Contrast: 70mL OMNIPAQUE IOHEXOL 300 MG/ML  SOLN  Comparison: Pelvis radiograph performed earlier today at 03:31 a.m.  Findings: Mild bibasilar atelectasis or scarring is noted.  Scattered  foci of hypoattenuation within the liver, measuring up to 1.2 cm, are nonspecific but may reflect small cysts.  The liver is otherwise unremarkable in appearance.  The spleen is normal in appearance.  The gallbladder is within normal limits.  The pancreas and adrenal glands are unremarkable.  Contrast is noted within the renal calyces.  This limits evaluation for renal stones.  Nonspecific perinephric stranding is noted bilaterally.  The kidneys are otherwise unremarkable in appearance. No hydronephrosis is seen.  No free fluid is identified.  The small bowel is unremarkable in appearance.  The stomach is within normal limits.  No acute vascular abnormalities are seen.  The appendix is normal in caliber, without evidence for appendicitis.  Diffuse diverticulosis is noted along the distal descending and proximal sigmoid colon, without evidence of diverticulitis.  A small umbilical hernia is noted, containing only fat, and demonstrating mild soft tissue inflammation.  The bladder is mildly distended and contains contrast; the bladder appears intact, without definite evidence of bladder injury, though there is a small amount of blood and soft tissue stranding noted along the right side of the bladder, extending about the right superior pubic ramus fracture.  Blood is also noted tracking along the right pelvic sidewall and anterior to the sacrum.  The prostate remains normal in size.  No inguinal lymphadenopathy is seen.  There is a minimally displaced slightly comminuted fracture through the right sacral ala, involving the inferior neural foramina at the levels of S2, S3 and S4. There are also comminuted fractures involving the right superior and inferior pubic rami.  In addition, there is a minimally displaced fracture involving the right transverse process of L5.  No additional fractures are identified.  IMPRESSION:  1.  Minimally displaced slightly comminuted fracture through the right sacral ala, involving the  inferior neural foramina at the levels of S2, S3 and S4. 2.  Minimally displaced fracture involving the right transverse process of L5; comminuted fractures involving  the right superior and inferior pubic rami. 3.  Small amount of blood and soft tissue stranding noted along the right side of the bladder, extending about the right superior pubic ramus fracture.  Blood also noted tracking along the right pelvic sidewall and anterior to the sacrum.  No definite evidence of bladder injury. 4.  Small umbilical hernia, containing only fat, and demonstrating mild soft tissue inflammation. 5.  Diffuse diverticulosis along the distal descending and proximal sigmoid colon, without evidence of diverticulitis. 6.  Likely small hepatic cysts noted. 7.  Mild bibasilar atelectasis or scarring noted.  These results were called by telephone on 04/24/2012  at  06:32 a.m. to  [facility], who verbally acknowledged these results.  Original Report Authenticated By: Tonia Ghent, M.D.    Review of Systems  Cardiovascular: Positive for chest pain (Right).  Musculoskeletal:       Pelvic pain  Neurological: Positive for tingling (During attempts to move after fall). Negative for focal weakness and loss of consciousness.  All other systems reviewed and are negative.    Blood pressure 133/74, pulse 95, temperature 98.8 F (37.1 C), temperature source Oral, resp. rate 16, SpO2 96.00%. Physical Exam  Vitals reviewed. Constitutional: He is oriented to person, place, and time. He appears well-developed and well-nourished. No distress.  HENT:  Head: Normocephalic. Head is with laceration.    Right Ear: External ear normal.  Left Ear: External ear normal.  Nose: Nose normal.  Mouth/Throat: Oropharynx is clear and moist. No oropharyngeal exudate.  Eyes: Conjunctivae and EOM are normal. Pupils are equal, round, and reactive to light. Right eye exhibits no discharge. Left eye exhibits no discharge. No scleral icterus.    Neck: Normal range of motion. Neck supple. No tracheal deviation present. No thyromegaly present.  Cardiovascular: Normal rate, regular rhythm, normal heart sounds and intact distal pulses.  Exam reveals no gallop and no friction rub.   No murmur heard. Respiratory: Effort normal and breath sounds normal. No respiratory distress. He has no wheezes. He exhibits tenderness (Right anterior inferior).  GI: Soft. Bowel sounds are normal. He exhibits no distension. There is no tenderness. There is no rebound and no guarding.  Genitourinary: Penis normal.  Musculoskeletal:       Right hip: He exhibits tenderness.  Lymphadenopathy:    He has no cervical adenopathy.  Neurological: He is alert and oriented to person, place, and time. No cranial nerve deficit.  Skin: Skin is warm and dry. No rash noted. He is not diaphoretic. No erythema. No pallor.  Psychiatric: He has a normal mood and affect. His behavior is normal.     Assessment/Plan Fall Right rib fxs -- I suspect patient has 2-3 right rib fxs based on exam and slight irregularities on CT. Right sacral/rami fxs -- Dr. Lajoyce Corners to consult Right L5 TVP fx Obesity Hyperlipidemia  Admit to trauma, pain control and pulmonary toilet. Management of pelvic fxs per Dr. Isaac Laud, PA-C Pager: [phone] General Trauma PA Pager: [phone]  04/24/2012, 8:37 AM

## 2012-04-25 ENCOUNTER — Inpatient Hospital Stay (HOSPITAL_COMMUNITY): Payer: Self-pay

## 2012-04-25 LAB — CBC
HCT: 36.3 % — ABNORMAL LOW (ref 39.0–52.0)
Hemoglobin: 12 g/dL — ABNORMAL LOW (ref 13.0–17.0)
MCH: 27.8 pg (ref 26.0–34.0)
MCHC: 33.1 g/dL (ref 30.0–36.0)
MCV: 84 fL (ref 78.0–100.0)
Platelets: 153 10*3/uL (ref 150–400)
RBC: 4.32 MIL/uL (ref 4.22–5.81)
RDW: 13.9 % (ref 11.5–15.5)
WBC: 9.4 10*3/uL (ref 4.0–10.5)

## 2012-04-25 MED ORDER — DSS 100 MG PO CAPS: 100.0000 mg | ORAL_CAPSULE | Freq: Two times a day (BID) | ORAL | Status: AC

## 2012-04-25 MED ORDER — POLYETHYLENE GLYCOL 3350 17 G PO PACK: 17.0000 g | PACK | Freq: Every day | ORAL | Status: AC

## 2012-04-25 MED ORDER — OXYCODONE-ACETAMINOPHEN 5-325 MG PO TABS: 1.0000 | ORAL_TABLET | ORAL | Status: AC | PRN

## 2012-04-25 NOTE — Progress Notes (Signed)
Clinical Social Work Department BRIEF PSYCHOSOCIAL ASSESSMENT 04/25/2012  Patient:  Charles Brandt, Charles Brandt     Account Number:  [id number]     Admit date:  04/24/2012  Clinical Social Worker:  Andres Shad  Date/Time:  04/25/2012 12:20 PM  Referred by:  Physician  Date Referred:  04/25/2012 Referred for  Other - See comment   Other Referral:   Trauma patient, review of needs and completion of SBIRT   Interview type:  Patient Other interview type:   Trauma Rounds    PSYCHOSOCIAL DATA Living Status:  ALONE Admitted from facility:   Level of care:   Primary support name:  girlfriend Primary support relationship to patient:  FRIEND Degree of support available:   very supportive    CURRENT CONCERNS Current Concerns  None Noted   Other Concerns:    SOCIAL WORK ASSESSMENT / PLAN Patient came in as a trauma when he was trimming his tree and branch fell knocking him over causing multiple pelvic fractures.  patient was unable to move himself from the ground thus he laid on his porch until his girlfriend could help him. Patient lives at home, has adequate support and doing well with no needs noted.   Assessment/plan status:  No Further Intervention Required Other assessment/ plan:   CM working with patient with home needs   Information/referral to community resources:   None noted and patient declined    PATIENT'S/FAMILY'S RESPONSE TO PLAN OF CARE: All appreciative of visit and agreeable for no interventions    Charles Brandt, MSW LCSW [phone] Coverage for Trauma Social Worker: Macario Golds

## 2012-04-25 NOTE — Discharge Instructions (Signed)
Make sure someone is with you when you are on your feet.

## 2012-04-25 NOTE — Progress Notes (Signed)
Physical Therapy Treatment Patient Details Name: Charles Brandt MRN: [id number] DOB: 1956-09-27 Today's Date: 04/25/2012 Time: 4098-1191 PT Time Calculation (min): 32 min  PT Assessment / Plan / Recommendation Comments on Treatment Session  Excellent progress today. Thinks he will be getting a w/c for home from a friend, I agree with this plan.     Follow Up Recommendations  Home health PT;Supervision for mobility/OOB    Barriers to Discharge Decreased caregiver support      Equipment Recommendations  Rolling walker with 5" wheels    Recommendations for Other Services    Frequency 7X/week   Plan Discharge plan remains appropriate;Frequency remains appropriate    Precautions / Restrictions Precautions Precautions: Fall Restrictions RLE Weight Bearing: Touchdown weight bearing       Mobility  Bed Mobility Bed Mobility: Sitting - Scoot to Edge of Bed Supine to Sit: With rails;HOB elevated;1: +2 Total assist Supine to Sit: Patient Percentage: 40% Sitting - Scoot to Edge of Bed: 3: Mod assist Sit to Supine: 1: +2 Total assist Sit to Supine: Patient Percentage: 10% Scooting to HOB: 1: +2 Total assist Scooting to [facility]: Patient Percentage: 0% Details for Bed Mobility Assistance: use of pad to scoot; specific sequencing cues; again facilitation to bring legs off bed with pad to bring hips around as facilitation posteriorly to bring trunk upright Transfers Transfers: Stand Pivot Transfers Sit to Stand: 1: +2 Total assist;From bed;With upper extremity assist;From elevated surface Sit to Stand: Patient Percentage: 80% Stand to Sit: 3: Mod assist;To bed;To chair/3-in-1;With upper extremity assist Stand Pivot Transfers: 1: +2 Total assist Stand Pivot Transfers: Patient Percentage: 70% Details for Transfer Assistance: moving very slow because of pain; cues for weight bearing and sequencing/hand placement; facilitation to steady himself; sit<->stand x2; SPT bed->chair with RW and pt  performing heel toe pivot to chair with verbal and tactile cues for sequencing    Exercises      PT Goals Acute Rehab PT Goals PT Goal Formulation: With patient Time For Goal Achievement: 05/08/12 Pt will go Supine/Side to Sit: with min assist PT Goal: Supine/Side to Sit - Progress: Progressing toward goal Pt will go Sit to Supine/Side: with min assist PT Goal: Sit to Supine/Side - Progress: Goal set today Pt will go Sit to Stand: with min assist PT Goal: Sit to Stand - Progress: Progressing toward goal Pt will go Stand to Sit: with min assist PT Goal: Stand to Sit - Progress: Progressing toward goal Pt will Transfer Bed to Chair/Chair to Bed: with min assist PT Transfer Goal: Bed to Chair/Chair to Bed - Progress: Progressing toward goal Pt will Ambulate: with min assist;with least restrictive assistive device PT Goal: Ambulate - Progress: Goal set today Pt will Go Up / Down Stairs: 3-5 stairs;with rail(s);with least restrictive assistive device;with min assist PT Goal: Up/Down Stairs - Progress: Goal set today  Visit Information  Last PT Received On: 04/25/12 Assistance Needed: +2 PT/OT Co-Evaluation/Treatment: Yes    Subjective Data  Subjective: We'll see how this goes.  Patient Stated Goal: home   Cognition  Overall Cognitive Status: Appears within functional limits for tasks assessed/performed Arousal/Alertness: Awake/alert Orientation Level: Appears intact for tasks assessed Behavior During Session: [facility] for tasks performed    Balance     End of Session PT - End of Session Equipment Utilized During Treatment: Gait belt Activity Tolerance: Patient tolerated treatment well Patient left: in chair;with call bell/phone within reach;with nursing in room Nurse Communication: Mobility status    [facility] HELEN 04/25/2012,  10:37 AM

## 2012-04-25 NOTE — Progress Notes (Signed)
DC home with girlfriend. Ambulated slowly to w/c, gait unsteady , assisted in dressing self.. Verbally understood DC instructions, no questions asked.

## 2012-04-25 NOTE — Care Management Note (Signed)
    Page 1 of 1   04/25/2012     5:30:11 PM   CARE MANAGEMENT NOTE 04/25/2012  Patient:  Charles Brandt, Charles Brandt   Account Number:  [id number]  Date Initiated:  04/24/2012  Documentation initiated by:  Carlyle Lipa  Subjective/Objective Assessment:   fall from ladder with pelvic ring fx--will not require surgical repair     Action/Plan:   home with girlfriend and DME when stable   Anticipated DC Date:  04/28/2012   Anticipated DC Plan:  HOME/SELF CARE      DC Planning Services  CM consult      [facility] Choice  HOME HEALTH   Choice offered to / List presented to:          HH arranged  HH-2 PT      [facility] agency  [facility]   Status of service:  Completed, signed off Medicare Important Message given?   (If response is "NO", the following Medicare IM given date fields will be blank) Date Medicare IM given:   Date Additional Medicare IM given:    Discharge Disposition:  HOME W HOME HEALTH SERVICES  Per UR Regulation:  Reviewed for med. necessity/level of care/duration of stay  If discussed at Long Length of Stay Meetings, dates discussed:    Comments:  04/25/12 17:25 MD order received for walker and home health PT.  Pt states he will get a walker at a local DME store. He accepted home health PT with [facility]. Referral called to [facility] with [facility]. Jim Like RN BSN CCM

## 2012-04-25 NOTE — Evaluation (Addendum)
Physical Therapy Evaluation (late entry for 04/24/12) Patient Details Name: Charles Brandt MRN: [id number] DOB: 1956/11/25 Today's Date: 04/24/12 Time: 7829-5621 PT Time Calculation (min): 39 min  PT Assessment / Plan / Recommendation Clinical Impression  Pt is 56 y/o male admitted after fall from ladder suffering pelvic and sacral fracture now TDWB on RLE. Presents to therapy today with limited mobility and independence secondary to pain and weight bearing status. Will benefit physical therapy in the acute setting to address these and the below problem list so as to maximize pt's function and independence and decrease burden of care once d/c'd home. If pt able to progress as expected I would recommend HHPT and assist for any out of bed mobility as well as RW.     PT Assessment  Patient needs continued PT services    Follow Up Recommendations  Home health PT;Supervision for mobility/OOB    Barriers to Discharge Decreased caregiver support      lEquipment Recommendations  Rolling walker with 5" wheels    Recommendations for Other Services     Frequency 7X/week    Precautions / Restrictions Precautions Precautions: Fall Restrictions RLE Weight Bearing: Touchdown weight bearing         Mobility  Bed Mobility Bed Mobility: Sit to Supine;Scooting to HOB Supine to Sit: 1: +2 Total assist;With rails;HOB elevated (30 degrees) Supine to Sit: Patient Percentage: 20% Sit to Supine: 1: +2 Total assist Sit to Supine: Patient Percentage: 10% Scooting to HOB: 1: +2 Total assist Scooting to [facility]: Patient Percentage: 0% Details for Bed Mobility Assistance: thorough step by step sequencing cues; assist behind to bring trunk upright, LUE pulling on rail and assist with pad to scoot hips EOB; total assist to bring legs to bed level and slowly lower trunk with HOB elevated 45 degrees;  Transfers Sit to Stand: 1: +2 Total assist Sit to Stand: Patient Percentage: 50% Stand to Sit: 2: Max  assist Details for Transfer Assistance: sequencing cues; increased time for pt to gather confidence; cues for TDWB and hand placement    Exercises     PT Diagnosis: Difficulty walking;Abnormality of gait;Generalized weakness;Acute pain  PT Problem List: Decreased strength;Decreased range of motion;Decreased activity tolerance;Decreased balance;Decreased mobility;Decreased knowledge of use of DME;Pain;Decreased knowledge of precautions;Obesity PT Treatment Interventions: DME instruction;Gait training;Stair training;Functional mobility training;Therapeutic activities;Therapeutic exercise;Balance training;Neuromuscular re-education;Patient/family education   PT Goals Acute Rehab PT Goals PT Goal Formulation: With patient Time For Goal Achievement: 05/08/12 Pt will go Supine/Side to Sit: with min assist PT Goal: Supine/Side to Sit - Progress: Goal set today Pt will go Sit to Supine/Side: with min assist PT Goal: Sit to Supine/Side - Progress: Goal set today Pt will go Sit to Stand: with min assist PT Goal: Sit to Stand - Progress: Goal set today Pt will go Stand to Sit: with min assist PT Goal: Stand to Sit - Progress: Goal set today Pt will Transfer Bed to Chair/Chair to Bed: with min assist PT Transfer Goal: Bed to Chair/Chair to Bed - Progress: Goal set today Pt will Ambulate: with min assist;with least restrictive assistive device PT Goal: Ambulate - Progress: Goal set today Pt will Go Up / Down Stairs: 3-5 stairs;with rail(s);with least restrictive assistive device;with min assist PT Goal: Up/Down Stairs - Progress: Goal set today  Visit Information  Last PT Received On: 04/24/12 Assistance Needed: +2 PT/OT Co-Evaluation/Treatment: Yes    Subjective Data  Subjective: Oh wonderful. I knew yall would be here. Patient Stated Goal: home   Prior  Functioning  Home Living Available Help at Discharge: Family Type of Home: House Home Access: Stairs to enter Entrance Stairs-Rails:   (will install rail) Home Layout: One level Bathroom Shower/Tub: Tub/shower unit;Curtain Firefighter: Standard How Accessible: Accessible via walker Home Adaptive Equipment: None Prior Function Level of Independence: Independent Able to Take Stairs?: Reciprically Driving: Yes Vocation: Full time employment Communication Communication: No difficulties    Cognition  Overall Cognitive Status: Appears within functional limits for tasks assessed/performed Arousal/Alertness: Awake/alert Orientation Level: Appears intact for tasks assessed Behavior During Session: [facility] for tasks performed    Extremity/Trunk Assessment Right Upper Extremity Assessment RUE ROM/Strength/Tone: Within functional levels RUE Sensation: WFL - Light Touch;WFL - Proprioception RUE Coordination: WFL - gross/fine motor Left Upper Extremity Assessment LUE ROM/Strength/Tone: Within functional levels LUE Sensation: WFL - Light Touch;WFL - Proprioception LUE Coordination: WFL - gross/fine motor Right Lower Extremity Assessment RLE ROM/Strength/Tone: Unable to fully assess;Due to pain RLE ROM/Strength/Tone Deficits: pt with full active DF/PF RLE Sensation: WFL - Light Touch;WFL - Proprioception Left Lower Extremity Assessment LLE ROM/Strength/Tone: Unable to fully assess;Due to pain LLE ROM/Strength/Tone Deficits: full active DF/PF LLE Sensation: WFL - Light Touch;WFL - Proprioception Trunk Assessment Trunk Assessment: Normal   Balance    End of Session PT - End of Session Equipment Utilized During Treatment: Gait belt Activity Tolerance: Patient limited by pain;Patient tolerated treatment well Patient left: in bed;with call bell/phone within reach;with family/visitor present Nurse Communication: Mobility status   WHITLOW,Jairus Tonne HELEN 04/25/2012, 8:05 AM

## 2012-04-25 NOTE — Progress Notes (Signed)
Doing well.  Got up with PT to chair.  Still going slowly.  CPM, may not be able to go home until tomorrow.  This patient has been seen and I agree with the findings and treatment plan.  Marta Lamas. Gae Bon, MD, FACS [phone] (pager) [phone] (direct pager) Trauma Surgeon

## 2012-04-25 NOTE — Discharge Summary (Signed)
Physician Discharge Summary  Patient ID: Charles Brandt MRN: [id number] DOB/AGE: August 28, 1956 56 y.o.  Admit date: 04/24/2012 Discharge date: 04/25/2012  Discharge Diagnoses Patient Active Problem List  Diagnoses Date Noted  . Fall from ladder 04/24/2012  . Multiple right rib fractures 04/24/2012  . Laceration of face 04/24/2012  . Fracture of sacrum 04/24/2012  . Right sup/inf pubic rami fxs 04/24/2012  . Right L5 TVP fx 04/24/2012  . Hyperlipidemia 04/24/2012    Consultants Dr. Lajoyce Corners for orthopedic surgery  Procedures None  HPI: [name] male was trimming a tree approximately 6-7 feet on a ladder when a limb hit him in the face knocking him off the ladder. He landed on his right side. No LOC, no amnesia. Was unable to walk. Crawled to his porch and lay there until he was discovered by GF several hours later. Came to ED via EMS as a non-trauma code. C/o right anterior chest pain and right pelvic pain. Workup showed two right rib fractures and the above-mentioned pelvic fractures. He was admitted and orthopedic surgery was consulted.   Hospital Course: Orthopedic surgery determined non-operative treatment would best serve the patient. He was mobilized with physical therapy and did fairly well. He did not have any respiratory complications from his rib fractures. His pain was controlled with oral medication and he was able to be discharged home in good condition.    Medication List  As of 04/25/2012  2:50 PM   TAKE these medications         DSS 100 MG Caps   Take 100 mg by mouth 2 (two) times daily.      gemfibrozil 600 MG tablet   Commonly known as: LOPID   Take 600 mg by mouth 2 (two) times daily before a meal.      oxyCODONE-acetaminophen 5-325 MG per tablet   Commonly known as: PERCOCET   Take 1-2 tablets by mouth every 4 (four) hours as needed for pain.      polyethylene glycol packet   Commonly known as: MIRALAX / GLYCOLAX   Take 17 g by mouth daily.      pravastatin 20 MG  tablet   Commonly known as: PRAVACHOL   Take 20 mg by mouth at bedtime.             Follow-up Information    Schedule an appointment as soon as possible for a visit with Nadara Mustard, MD.   Contact information:   [address] [place] [address] [phone]       Call [facility]. (As needed)    Contact information:   [address] [place] [address] [phone]         Signed: Freeman Caldron, PA-C Pager: [phone] General Trauma PA Pager: [phone]  04/25/2012, 2:50 PM

## 2012-04-25 NOTE — Progress Notes (Signed)
Patient ID: Charles Brandt, male   DOB: 11/19/1956, 56 y.o.   MRN: [id number]   LOS: 1 day   Subjective: No unexpected c/o. Pain medicine working.  Objective: Vital signs in last 24 hours: Temp:  [97.8 F (36.6 C)-99.5 F (37.5 C)] 98.7 F (37.1 C) (05/10 0619) Pulse Rate:  [58-94] 80  (05/10 0619) Resp:  [16-20] 18  (05/10 0619) BP: (106-142)/(58-77) 109/68 mmHg (05/10 0619) SpO2:  [94 %-100 %] 95 % (05/10 0619) Weight:  [104.327 kg (230 lb)] 104.327 kg (230 lb) (05/09 1238) Last BM Date: 04/23/12   IS:   Lab Results:  CBC  Basename 04/25/12 0650 04/24/12 0201  WBC 9.4 16.0*  HGB 12.0* 13.7  HCT 36.3* 40.1  PLT 153 190   BMET  Basename 04/24/12 0201  NA 139  K 4.9  CL 105  CO2 24  GLUCOSE 177*  BUN 16  CREATININE 0.94  CALCIUM 9.3    CXR: Pending   General appearance: alert and no distress Resp: clear to auscultation bilaterally Cardio: regular rate and rhythm GI: normal findings: bowel sounds normal and soft, non-tender Extremities: NVI  Assessment/Plan: Fall  Right rib fxs -- Pain control and pulmonary toilet Right sacral/rami fxs -- TDWB on right Right L5 TVP fx  Obesity  Hyperlipidemia FEN -- No issues VTE -- Lovenox, SCD's Dispo -- Home today if PT/OT clear    Freeman Caldron, PA-C Pager: [phone] General Trauma PA Pager: [phone]   04/25/2012

## 2012-04-26 NOTE — Discharge Summary (Signed)
This patient has been seen and I agree with the findings and treatment plan.  Marja Adderley O. Candela Krul, III, MD, FACS [phone] (pager) [phone] (direct pager) Trauma Surgeon  

## 2012-09-08 IMAGING — CT CT ABD-PELV W/ CM
1 of 3 series · 13 of 32 positions shown, 17 images · IV contrast (omnipaque)
Comparison: Pelvis radiograph performed earlier today at [DATE] a.m.

CLINICAL DATA: Status post fall off ladder; right pubic rami
fractures.  Assess for pelvic injury.

CT ABDOMEN AND PELVIS WITH CONTRAST
TECHNIQUE: Multidetector CT imaging of the abdomen and pelvis was
performed following the standard protocol during bolus
administration of intravenous contrast.
Contrast: 70mL OMNIPAQUE IOHEXOL 300 MG/ML  SOLN

[Series 2: abd/pelv with 5.0 b31f st · axial · 0.88mm/px · z∈[-512,-22]mm · 13 of 110 slices shown, 17 images]
[im 6/110  soft-tissue]
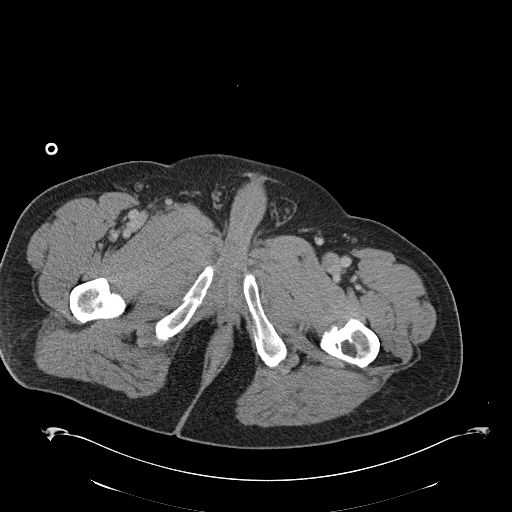
[im 6/110  bone]
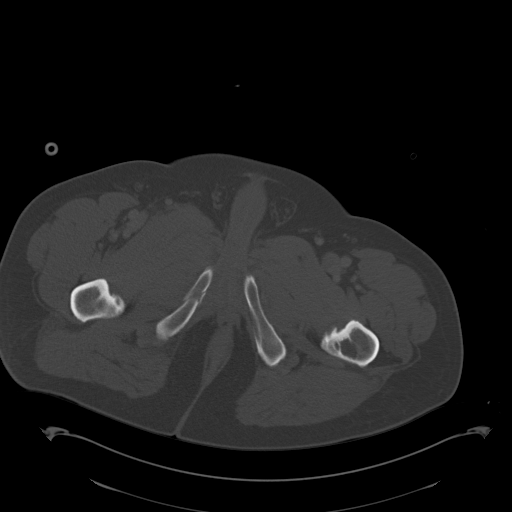
[im 18/110  soft-tissue]
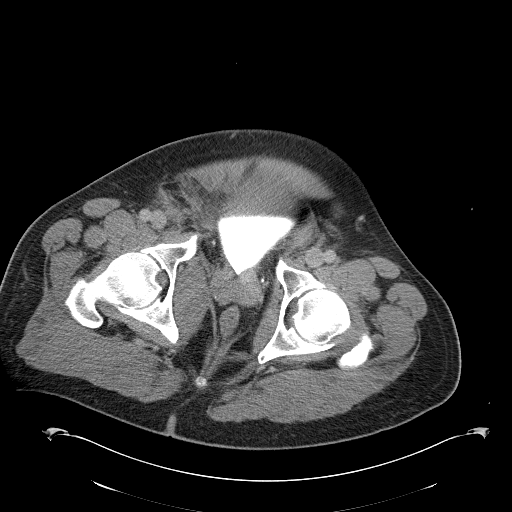
[im 29/110  soft-tissue]
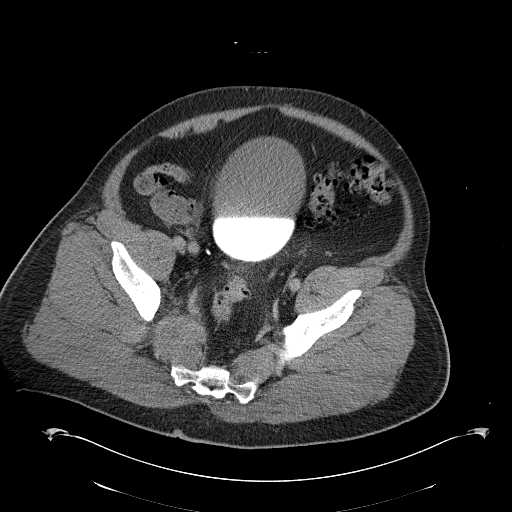
[im 35/110  soft-tissue]
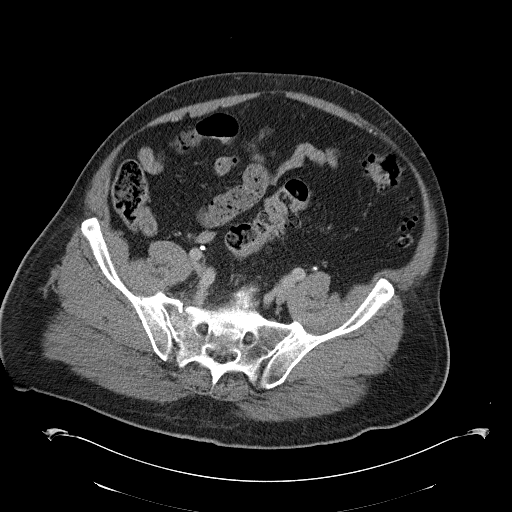
[im 46/110  soft-tissue]
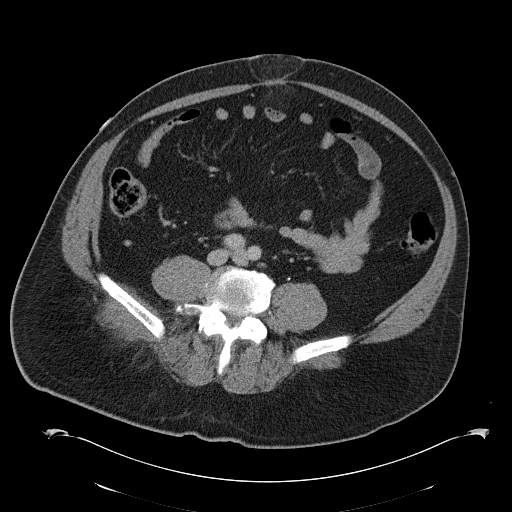
[im 58/110  soft-tissue]
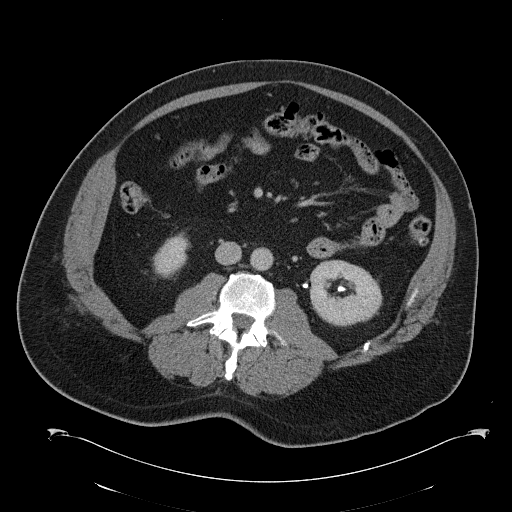
[im 64/110  soft-tissue]
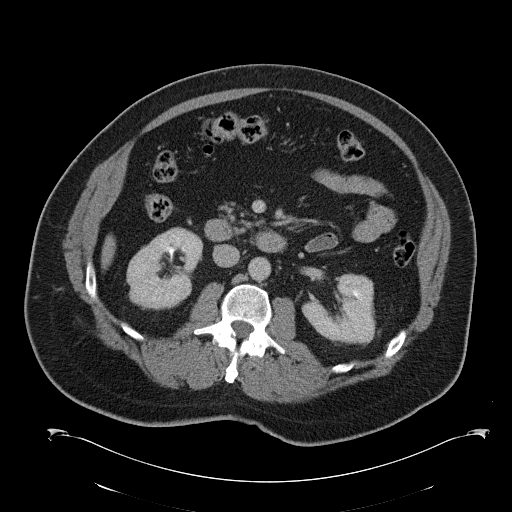
[im 75/110  soft-tissue]
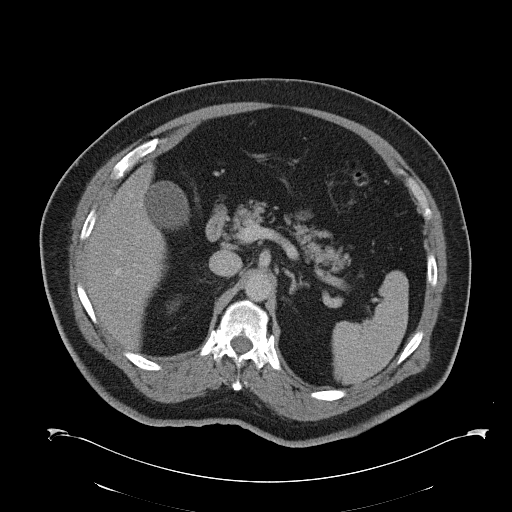
[im 81/110  soft-tissue]
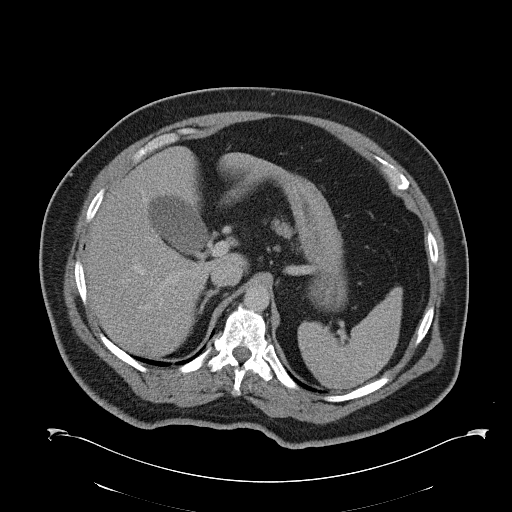
[im 81/110  bone]
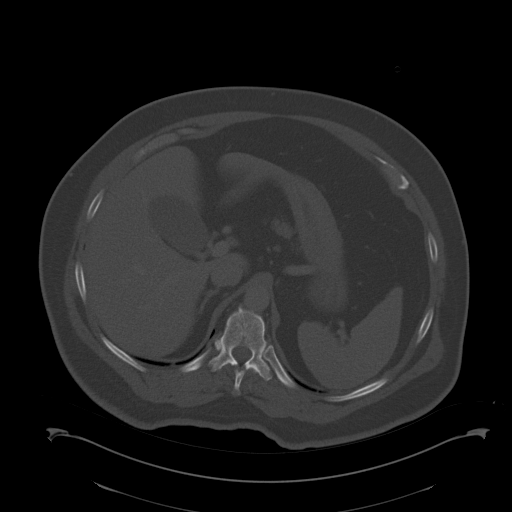
[im 87/110  lung]
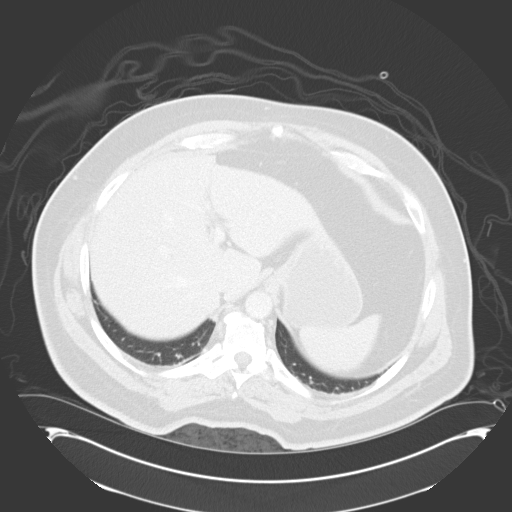
[im 92/110  soft-tissue]
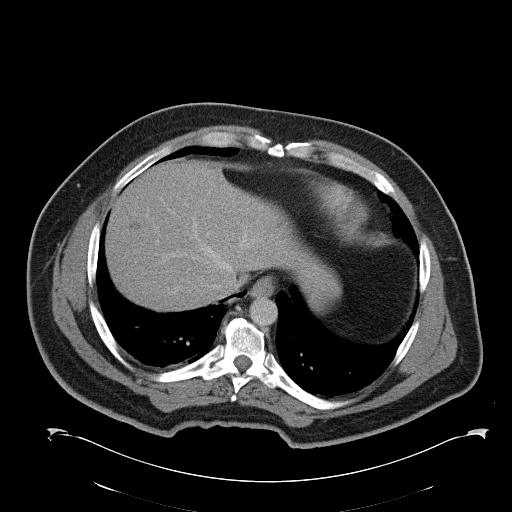
[im 92/110  lung]
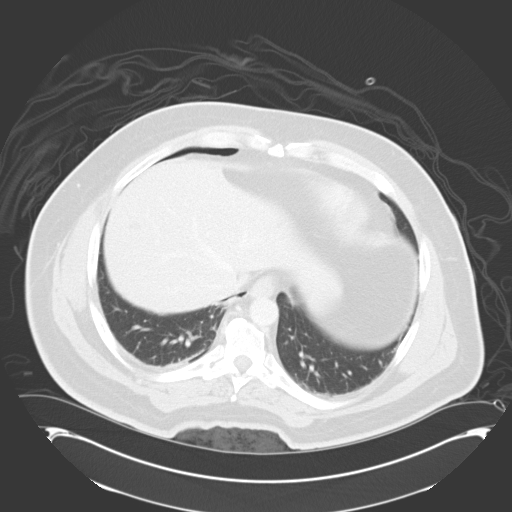
[im 98/110  lung]
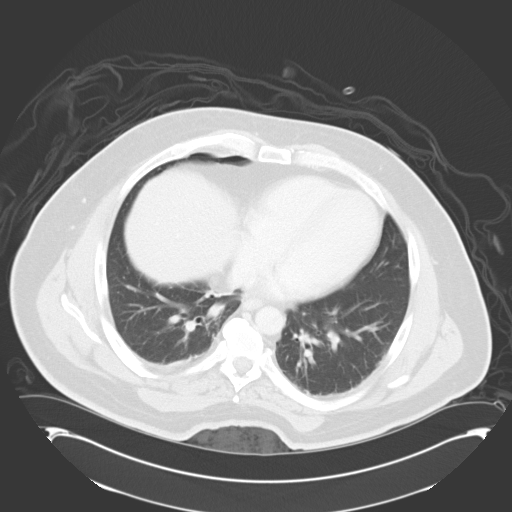
[im 104/110  soft-tissue]
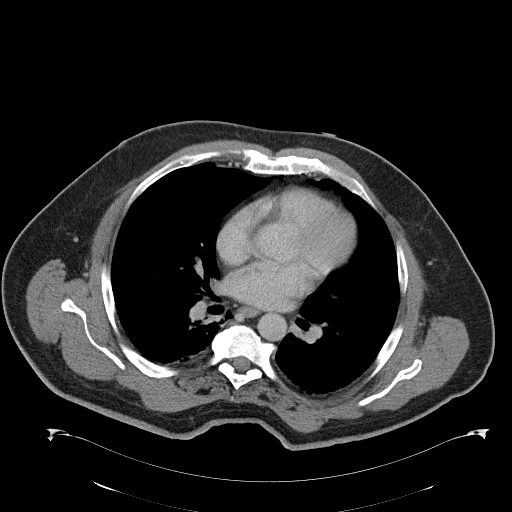
[im 104/110  lung]
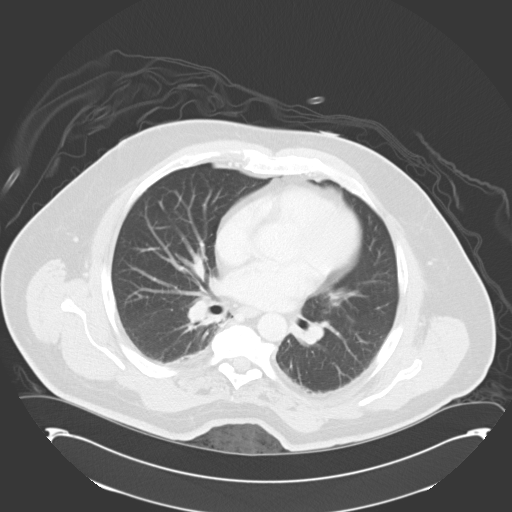

[13 of 32 positions shown; findings below may reference images not displayed]

FINDINGS: Mild bibasilar atelectasis or scarring is noted.

Scattered foci of hypoattenuation within the liver, measuring up to
1.2 cm, are nonspecific but may reflect small cysts.  The liver is
otherwise unremarkable in appearance.  The spleen is normal in
appearance.  The gallbladder is within normal limits.  The pancreas
and adrenal glands are unremarkable.

Contrast is noted within the renal calyces.  This limits evaluation
for renal stones.  Nonspecific perinephric stranding is noted
bilaterally.  The kidneys are otherwise unremarkable in appearance.
No hydronephrosis is seen.

No free fluid is identified.  The small bowel is unremarkable in
appearance.  The stomach is within normal limits.  No acute
vascular abnormalities are seen.

The appendix is normal in caliber, without evidence for
appendicitis.  Diffuse diverticulosis is noted along the distal
descending and proximal sigmoid colon, without evidence of
diverticulitis.

A small umbilical hernia is noted, containing only fat, and
demonstrating mild soft tissue inflammation.

The bladder is mildly distended and contains contrast; the bladder
appears intact, without definite evidence of bladder injury, though
there is a small amount of blood and soft tissue stranding noted
along the right side of the bladder, extending about the right
superior pubic ramus fracture.  Blood is also noted tracking along
the right pelvic sidewall and anterior to the sacrum.  The prostate
remains normal in size.  No inguinal lymphadenopathy is seen.

There is a minimally displaced slightly comminuted fracture through
the right sacral ala, involving the inferior neural foramina at the
levels of S2, S3 and S4. There are also comminuted fractures
involving the right superior and inferior pubic rami.  In addition,
there is a minimally displaced fracture involving the right
transverse process of L5.  No additional fractures are identified.
IMPRESSION: 1.  Minimally displaced slightly comminuted fracture through the
right sacral ala, involving the inferior neural foramina at the
levels of S2, S3 and S4.
2.  Minimally displaced fracture involving the right transverse
process of L5; comminuted fractures involving the right superior
and inferior pubic rami.
3.  Small amount of blood and soft tissue stranding noted along the
right side of the bladder, extending about the right superior pubic
ramus fracture.  Blood also noted tracking along the right pelvic
sidewall and anterior to the sacrum.  No definite evidence of
bladder injury.
4.  Small umbilical hernia, containing only fat, and demonstrating
mild soft tissue inflammation.
5.  Diffuse diverticulosis along the distal descending and proximal
sigmoid colon, without evidence of diverticulitis.
6.  Likely small hepatic cysts noted.
7.  Mild bibasilar atelectasis or scarring noted.

These results were called by telephone on 04/24/2012  at  [DATE]
a.m. to  Freddie Georgiev PA, who verbally acknowledged these results.

## 2012-09-09 IMAGING — CR DG CHEST 1V PORT
1 series · 1 of 1 positions shown · non-contrast
Comparison: Chest CT 04/24/2012

CLINICAL DATA: Rib fractures.  Pain when taking a deep breath.

PORTABLE CHEST - 1 VIEW

[view not recorded]
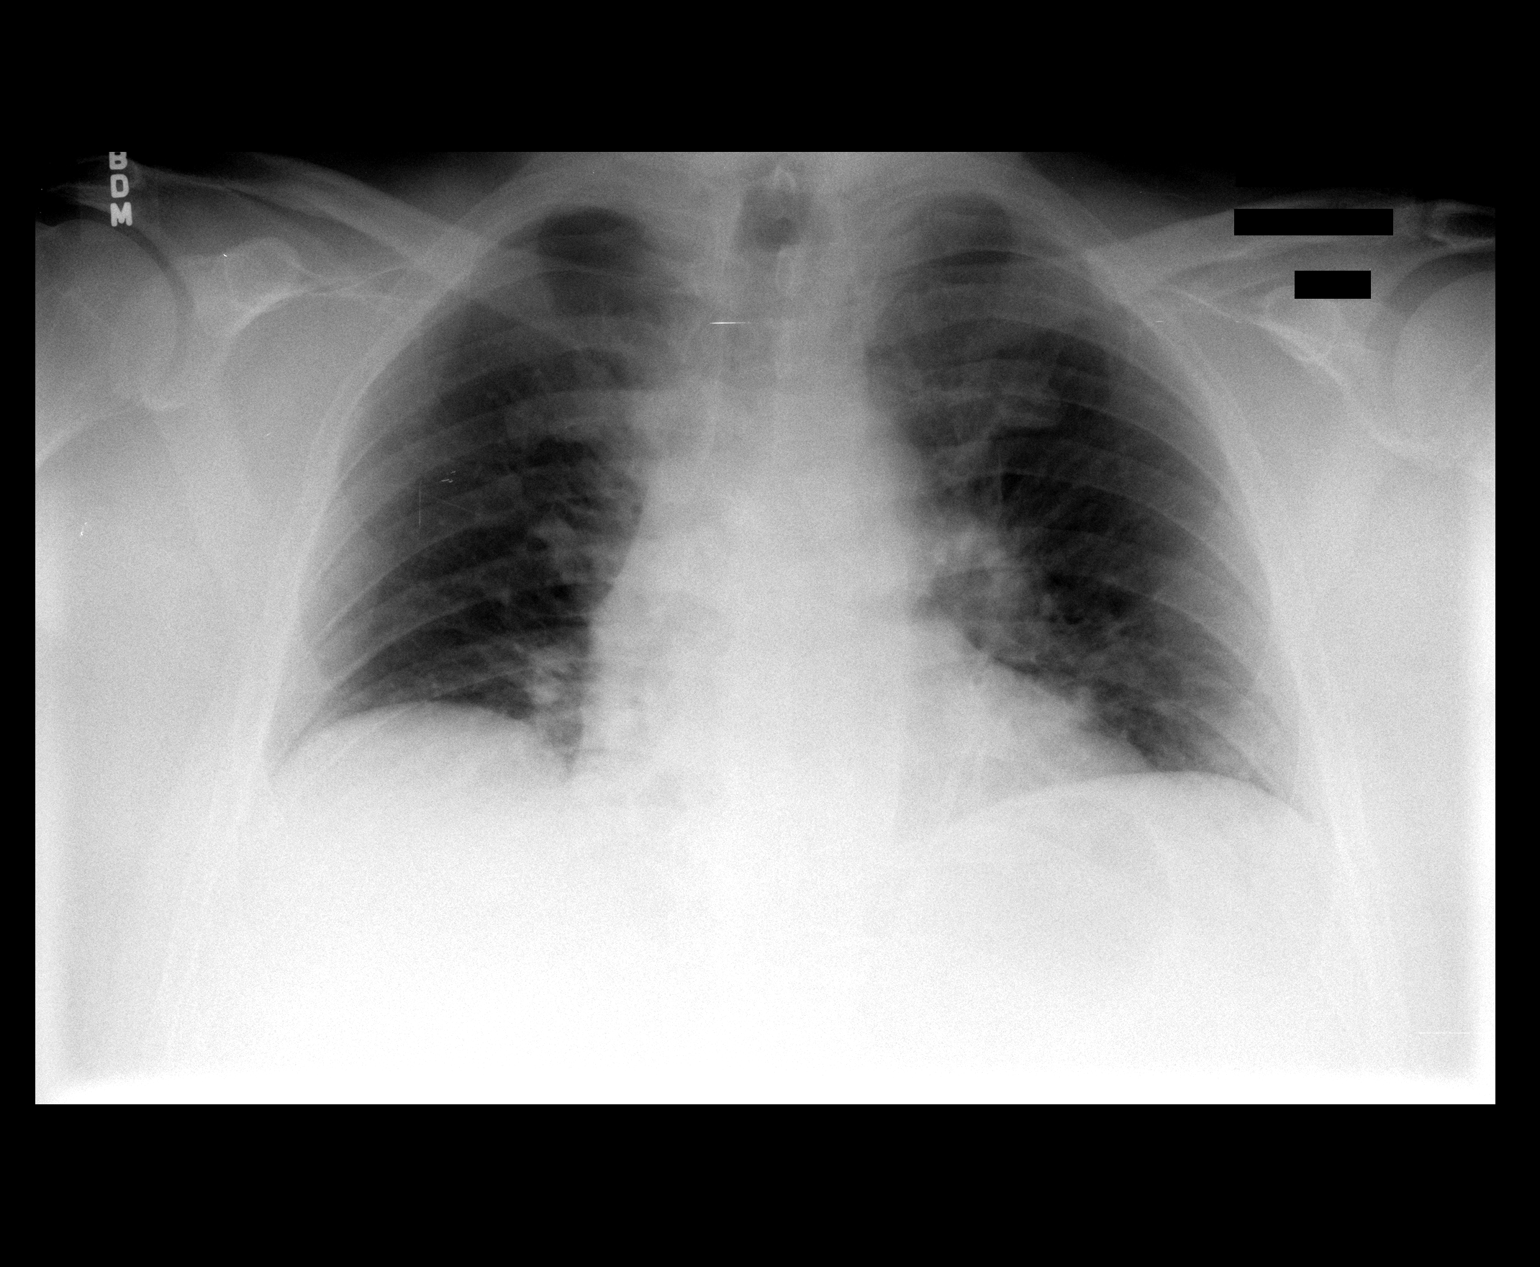

[1 of 1 positions shown; findings below may reference images not displayed]

FINDINGS: Single view of the chest demonstrates low lung volumes.
No focal airspace disease.  There may be a mildly displaced right
lower rib fracture but this area is poorly evaluated.  Heart size
is within normal limits.  Trachea is midline.

The prior CT demonstrated a tiny right basilar pneumothorax.  There
is no significant pneumothorax on this examination.
IMPRESSION: Low lung volumes without focal disease.

Negative for pneumothorax.

## 2015-10-27 ENCOUNTER — Telehealth: Payer: Self-pay

## 2015-10-27 NOTE — Telephone Encounter (Signed)
Pt left Vm that he is ready to schedule the colonoscopy soon. [phone]( [phone]).

## 2015-11-02 ENCOUNTER — Other Ambulatory Visit: Payer: Self-pay

## 2015-11-02 DIAGNOSIS — Z1211 Encounter for screening for malignant neoplasm of colon: Secondary | ICD-10-CM

## 2015-11-02 NOTE — Telephone Encounter (Signed)
Gastroenterology Pre-Procedure Review  Request Date: 11/02/2015 Requesting Physician: Emilio MathJenny Baucom, PA  PATIENT REVIEW QUESTIONS: The patient responded to the following health history questions as indicated:    1. Diabetes Melitis: no 2. Joint replacements in the past 12 months: no 3. Major health problems in the past 3 months: no 4. Has an artificial valve or MVP: no 5. Has a defibrillator: no 6. Has been advised in past to take antibiotics in advance of a procedure like teeth cleaning: no 7. Family history of colon cancer: no  8. Alcohol Use: no 9. History of sleep apnea: no     MEDICATIONS & ALLERGIES:    Patient reports the following regarding taking any blood thinners:   Plavix? no Aspirin? no Coumadin? no  Patient confirms/reports the following medications:  Current Outpatient Prescriptions  Medication Sig Dispense Refill  . lisinopril (PRINIVIL,ZESTRIL) 10 MG tablet Take 10 mg by mouth daily.    Marland Kitchen. gemfibrozil (LOPID) 600 MG tablet Take 600 mg by mouth 2 (two) times daily before a meal.    . pravastatin (PRAVACHOL) 20 MG tablet Take 20 mg by mouth at bedtime.     No current facility-administered medications for this visit.    Patient confirms/reports the following allergies:  No Known Allergies  No orders of the defined types were placed in this encounter.    AUTHORIZATION INFORMATION Primary Insurance:   ID #:   Group #:  Pre-Cert / Auth required:  Pre-Cert / Auth #:   Secondary Insurance:   ID #:  Group #:  Pre-Cert / Auth required: Pre-Cert / Auth #:   SCHEDULE INFORMATION: Procedure has been scheduled as follows:  Date: 11/23/2015              Time: 9:15 AM  Location: [facility] Short Stay  This Gastroenterology Pre-Precedure Review Form is being routed to the following provider(s): R. Roetta SessionsMichael Rourk, MD

## 2015-11-03 NOTE — Telephone Encounter (Signed)
Ok to schedule.

## 2015-11-04 MED ORDER — PEG 3350-KCL-NA BICARB-NACL 420 G PO SOLR: 4000.0000 mL | ORAL | Status: DC

## 2015-11-04 NOTE — Telephone Encounter (Signed)
Rx sent to the pharmacy and instructions mailed to pt.  

## 2015-11-15 ENCOUNTER — Telehealth: Payer: Self-pay

## 2015-11-15 NOTE — Telephone Encounter (Signed)
Pt called to reschedule his colonoscopy from 11/23/2015 to 11/30/2015 due to his work schedule. He is scheduled for 11/30/2015 @ 10:30 AM and I have mailed new instructions to him. Charles JonesCarolyn in Endo is aware.

## 2015-11-18 ENCOUNTER — Telehealth: Payer: Self-pay

## 2015-11-18 NOTE — Telephone Encounter (Signed)
[place] from [employer] for screening colon[place]. Notification to be scanned in.

## 2015-11-28 ENCOUNTER — Telehealth: Payer: Self-pay

## 2015-11-28 NOTE — Telephone Encounter (Signed)
LMOM for pt to call to update meds prior to procedure on 11/30/2015.

## 2015-11-30 ENCOUNTER — Ambulatory Visit (HOSPITAL_COMMUNITY)
Admission: RE | Admit: 2015-11-30 | Discharge: 2015-11-30 | Disposition: A | Discharge status: Home / Self Care | Payer: BLUE CROSS/BLUE SHIELD | Source: Ambulatory Visit | Attending: Internal Medicine | Admitting: Internal Medicine

## 2015-11-30 ENCOUNTER — Encounter (HOSPITAL_COMMUNITY): Payer: Self-pay | Admitting: *Deleted

## 2015-11-30 ENCOUNTER — Encounter (HOSPITAL_COMMUNITY)
Admission: RE | Disposition: A | Discharge status: Home / Self Care | Payer: Self-pay | Source: Ambulatory Visit | Attending: Internal Medicine

## 2015-11-30 DIAGNOSIS — K5730 Diverticulosis of large intestine without perforation or abscess without bleeding: Secondary | ICD-10-CM | POA: Diagnosis not present

## 2015-11-30 DIAGNOSIS — E785 Hyperlipidemia, unspecified: Secondary | ICD-10-CM | POA: Diagnosis not present

## 2015-11-30 DIAGNOSIS — I10 Essential (primary) hypertension: Secondary | ICD-10-CM | POA: Diagnosis not present

## 2015-11-30 DIAGNOSIS — Z79899 Other long term (current) drug therapy: Secondary | ICD-10-CM | POA: Diagnosis not present

## 2015-11-30 DIAGNOSIS — Z1211 Encounter for screening for malignant neoplasm of colon: Secondary | ICD-10-CM | POA: Diagnosis not present

## 2015-11-30 HISTORY — PX: COLONOSCOPY: SHX5424

## 2015-11-30 HISTORY — DX: Essential (primary) hypertension: I10

## 2015-11-30 SURGERY — COLONOSCOPY
Anesthesia: Moderate Sedation

## 2015-11-30 MED ORDER — ONDANSETRON HCL 4 MG/2ML IJ SOLN
INTRAMUSCULAR | Status: AC
  Administered 2015-11-30: 4 mg
  Filled 2015-11-30: qty 2

## 2015-11-30 MED ORDER — MEPERIDINE HCL 100 MG/ML IJ SOLN
INTRAMUSCULAR | Status: DC | PRN
  Administered 2015-11-30 (×2): 50 mg via INTRAVENOUS

## 2015-11-30 MED ORDER — MEPERIDINE HCL 100 MG/ML IJ SOLN
INTRAMUSCULAR | Status: AC
  Filled 2015-11-30: qty 2

## 2015-11-30 MED ORDER — ONDANSETRON HCL 4 MG/2ML IJ SOLN: 4.0000 mg | Freq: Once | INTRAMUSCULAR | Status: DC

## 2015-11-30 MED ORDER — STERILE WATER FOR IRRIGATION IR SOLN
Status: DC | PRN
  Administered 2015-11-30: 2.5 mL

## 2015-11-30 MED ORDER — MIDAZOLAM HCL 5 MG/5ML IJ SOLN
INTRAMUSCULAR | Status: DC | PRN
  Administered 2015-11-30 (×2): 2 mg via INTRAVENOUS

## 2015-11-30 MED ORDER — SODIUM CHLORIDE 0.9 % IV SOLN
INTRAVENOUS | Status: DC
  Administered 2015-11-30: 10:00:00 via INTRAVENOUS

## 2015-11-30 MED ORDER — MIDAZOLAM HCL 5 MG/5ML IJ SOLN
INTRAMUSCULAR | Status: AC
  Filled 2015-11-30: qty 10

## 2015-11-30 NOTE — Discharge Instructions (Signed)
Colonoscopy Discharge Instructions  Read the instructions outlined below and refer to this sheet in the next few weeks. These discharge instructions provide you with general information on caring for yourself after you leave the hospital. Your doctor may also give you specific instructions. While your treatment has been planned according to the most current medical practices available, unavoidable complications occasionally occur. If you have any problems or questions after discharge, call Dr. Jena Gaussourk at [phone]. ACTIVITY  You may resume your regular activity, but move at a slower pace for the next 24 hours.   Take frequent rest periods for the next 24 hours.   Walking will help get rid of the air and reduce the bloated feeling in your belly (abdomen).   No driving for 24 hours (because of the medicine (anesthesia) used during the test).    Do not sign any important legal documents or operate any machinery for 24 hours (because of the anesthesia used during the test).  NUTRITION  Drink plenty of fluids.   You may resume your normal diet as instructed by your doctor.   Begin with a light meal and progress to your normal diet. Heavy or fried foods are harder to digest and may make you feel sick to your stomach (nauseated).   Avoid alcoholic beverages for 24 hours or as instructed.  MEDICATIONS  You may resume your normal medications unless your doctor tells you otherwise.  WHAT YOU CAN EXPECT TODAY  Some feelings of bloating in the abdomen.   Passage of more gas than usual.   Spotting of blood in your stool or on the toilet paper.  IF YOU HAD POLYPS REMOVED DURING THE COLONOSCOPY:  No aspirin products for 7 days or as instructed.   No alcohol for 7 days or as instructed.   Eat a soft diet for the next 24 hours.  FINDING OUT THE RESULTS OF YOUR TEST Not all test results are available during your visit. If your test results are not back during the visit, make an appointment  with your caregiver to find out the results. Do not assume everything is normal if you have not heard from your caregiver or the medical facility. It is important for you to follow up on all of your test results.  SEEK IMMEDIATE MEDICAL ATTENTION IF:  You have more than a spotting of blood in your stool.   Your belly is swollen (abdominal distention).   You are nauseated or vomiting.   You have a temperature over 101.   You have abdominal pain or discomfort that is severe or gets worse throughout the day.    Diverticulosis information provided  Repeat screening colonoscopy in 10 years  See Dr. Lovell SheehanJenkins to get umbilical hernia repaired     Diverticulosis Diverticulosis is the condition that develops when small pouches (diverticula) form in the wall of your colon. Your colon, or large intestine, is where water is absorbed and stool is formed. The pouches form when the inside layer of your colon pushes through weak spots in the outer layers of your colon. CAUSES  No one knows exactly what causes diverticulosis. RISK FACTORS  Being older than 50. Your risk for this condition increases with age. Diverticulosis is rare in people younger than 40 years. By age 61, almost everyone has it.  Eating a low-fiber diet.  Being frequently constipated.  Being overweight.  Not getting enough exercise.  Smoking.  Taking over-the-counter pain medicines, like aspirin and ibuprofen. SYMPTOMS  Most people with diverticulosis  do not have symptoms. DIAGNOSIS  Because diverticulosis often has no symptoms, health care providers often discover the condition during an exam for other colon problems. In many cases, a health care provider will diagnose diverticulosis while using a flexible scope to examine the colon (colonoscopy). TREATMENT  If you have never developed an infection related to diverticulosis, you may not need treatment. If you have had an infection before, treatment may  include:  Eating more fruits, vegetables, and grains.  Taking a fiber supplement.  Taking a live bacteria supplement (probiotic).  Taking medicine to relax your colon. HOME CARE INSTRUCTIONS   Drink at least 6-8 glasses of water each day to prevent constipation.  Try not to strain when you have a bowel movement.  Keep all follow-up appointments. If you have had an infection before:  Increase the fiber in your diet as directed by your health care provider or dietitian.  Take a dietary fiber supplement if your health care provider approves.  Only take medicines as directed by your health care provider. SEEK MEDICAL CARE IF:   You have abdominal pain.  You have bloating.  You have cramps.  You have not gone to the bathroom in 3 days. SEEK IMMEDIATE MEDICAL CARE IF:   Your pain gets worse.  Yourbloating becomes very bad.  You have a fever or chills, and your symptoms suddenly get worse.  You begin vomiting.  You have bowel movements that are bloody or black. MAKE SURE YOU:  Understand these instructions.  Will watch your condition.  Will get help right away if you are not doing well or get worse.   This information is not intended to replace advice given to you by your health care provider. Make sure you discuss any questions you have with your health care provider.   Document Released: 08/30/2004 Document Revised: 12/08/2013 Document Reviewed: 10/28/2013 Elsevier Interactive Patient Education [employer]

## 2015-11-30 NOTE — H&P (Signed)
@  [email or web address]@   Primary Care Physician:  Default, Provider, MD Primary Gastroenterologist:  Dr. Jena Gaussourk  Pre-Procedure History & Physical: HPI:  Charles Brandt is a 59 y.o. male is here for a screening colonoscopy. Negative colonoscopy 11 years ago in [place]. No bowel symptoms. No family history of colon cancer.  Past Medical History  Diagnosis Date  . Hyperlipidemia   . Hypertension     Past Surgical History  Procedure Laterality Date  . Tonsillectomy    . Colonoscopy      Prior to Admission medications   Medication Sig Start Date End Date Taking? Authorizing Provider  lisinopril (PRINIVIL,ZESTRIL) 10 MG tablet Take 10 mg by mouth daily.   Yes Historical Provider, MD  polyethylene glycol-electrolytes (TRILYTE) 420 G solution Take 4,000 mLs by mouth as directed. 11/04/15  Yes Corbin Adeobert M Rourk, MD  gemfibrozil (LOPID) 600 MG tablet Take 600 mg by mouth 2 (two) times daily before a meal.    Historical Provider, MD  pravastatin (PRAVACHOL) 20 MG tablet Take 20 mg by mouth at bedtime.    Historical Provider, MD    Allergies as of 11/02/2015  . (No Known Allergies)    History reviewed. No pertinent family history.  Social History   Social History  . Marital Status: Single    Spouse Name: N/A  . Number of Children: N/A  . Years of Education: N/A   Occupational History  . Not on file.   Social History Main Topics  . Smoking status: Never Smoker   . Smokeless tobacco: Never Used  . Alcohol Use: Yes     Comment: Rare  . Drug Use: No  . Sexual Activity: Yes   Other Topics Concern  . Not on file   Social History Narrative    Review of Systems: See HPI, otherwise negative ROS  Physical Exam: BP 151/91 mmHg  Pulse 74  Temp(Src) 98.1 F (36.7 C) (Oral)  Resp 14  Ht 5\' 9"  (1.753 m)  Wt 230 lb (104.327 kg)  BMI 33.95 kg/m2  SpO2 98% General:   Alert,  Well-developed, well-nourished, pleasant and cooperative in NAD Head:  Normocephalic and atraumatic. Lungs:  Clear  throughout to auscultation.   No wheezes, crackles, or rhonchi. No acute distress. Heart:  Regular rate and rhythm; no murmurs, clicks, rubs,  or gallops. Abdomen:  Soft, nontender and nondistended. No masses, hepatosplenomegaly or hernias noted. Normal bowel sounds, without guarding, and without rebound.    Impression/Plan: Charles LeverOmer Dedmon is now here to undergo a screening colonoscopy.  Average risk screening examination.  Risks, benefits, limitations, imponderables and alternatives regarding colonoscopy have been reviewed with the patient. Questions have been answered. All parties agreeable.     Notice:  This dictation was prepared with Dragon dictation along with smaller phrase technology. Any transcriptional errors that result from this process are unintentional and may not be corrected upon review.

## 2015-12-01 NOTE — Op Note (Signed)
[facility] [address] [place], [id number]   COLONOSCOPY PROCEDURE REPORT  PATIENT: [name], Charles Brandt  MR#: [id number] BIRTHDATE: 1956-09-18 , 59  yrs. old GENDER: male ENDOSCOPIST: R.  Roetta SessionsMichael Abhiraj Dozal, MD FACP [facility] REFERRED XB:JYNWGBY:Jenny Baucom, PA PROCEDURE DATE:  11/30/2015 PROCEDURE:   Colonoscopy, screening INDICATIONS:Average risk colorectal cancer screening examination. MEDICATIONS: Versed 4 mg IV and Demerol 100 mg IV in divided doses. Zofran 4 mg IV. ASA CLASS:       Class II  CONSENT: The risks, benefits, alternatives and imponderables including but not limited to bleeding, perforation as well as the possibility of a missed lesion have been reviewed.  The potential for biopsy, lesion removal, etc. have also been discussed. Questions have been answered.  All parties agreeable.  Please see the history and physical in the medical record for more information.  DESCRIPTION OF PROCEDURE:   After the risks benefits and alternatives of the procedure were thoroughly explained, informed consent was obtained.  The digital rectal exam revealed no abnormalities of the rectum.   The EC-[id number] ([id number]  endoscope was introduced through the anus and advanced to the cecum, which was identified by both the appendix and ileocecal valve. No adverse events experienced.   The quality of the prep was adequate  The instrument was then slowly withdrawn as the colon was fully examined. Estimated blood loss is zero unless otherwise noted in this procedure report.      COLON FINDINGS: Normal-appearing rectal mucosa.  Scattered left-sided and transverse diverticula; the remainder of the colonic mucosa appeared normal.  Retroflexion was performed. .  Withdrawal time=7 minutes 0 seconds.  The scope was withdrawn and the procedure completed. COMPLICATIONS: There were no immediate complications.  ENDOSCOPIC IMPRESSION: Colonic diverticulosis  RECOMMENDATIONS: Return for 1 more  screening colonoscopy in 10 years. Recommend he see Dr. Franky MachoMark Jenkins, a general surgeon, for consideration of repair of umbilical hernia.  eSigned:  R. Roetta SessionsMichael Morgyn Marut, MD Jerrel IvoryFACP [facility] 11/30/2015 11:13 AM   cc:  CPT CODES: ICD CODES:  The ICD and CPT codes recommended by this software are interpretations from the data that the clinical staff has captured with the software.  The verification of the translation of this report to the ICD and CPT codes and modifiers is the sole responsibility of the health care institution and practicing physician where this report was generated.  [employer] will not be held responsible for the validity of the ICD and CPT codes included on this report.  AMA assumes no liability for data contained or not contained herein. CPT is a Publishing rights managerregistered trademark of the [employer].  PATIENT NAME:  [name], Charles MR#: [id number]

## 2015-12-02 ENCOUNTER — Encounter (HOSPITAL_COMMUNITY): Payer: Self-pay | Admitting: Internal Medicine

## 2020-05-12 ENCOUNTER — Other Ambulatory Visit: Payer: Self-pay | Admitting: Nurse Practitioner

## 2020-05-12 ENCOUNTER — Other Ambulatory Visit (HOSPITAL_COMMUNITY): Payer: Self-pay | Admitting: Nurse Practitioner

## 2020-05-12 DIAGNOSIS — N5089 Other specified disorders of the male genital organs: Secondary | ICD-10-CM

## 2020-05-20 ENCOUNTER — Other Ambulatory Visit: Payer: Self-pay

## 2020-05-20 ENCOUNTER — Ambulatory Visit (HOSPITAL_COMMUNITY)
Admission: RE | Admit: 2020-05-20 | Discharge: 2020-05-20 | Disposition: A | Discharge status: Home / Self Care | Payer: Medicare Other | Source: Ambulatory Visit | Attending: Nurse Practitioner | Admitting: Nurse Practitioner

## 2020-05-20 DIAGNOSIS — N5089 Other specified disorders of the male genital organs: Secondary | ICD-10-CM | POA: Diagnosis not present

## 2020-10-04 IMAGING — US US SCROTUM W/ DOPPLER COMPLETE
1 series · 14 of 25 positions shown · non-contrast
Comparison: None.

CLINICAL DATA: Left testicle discomfort

EXAM:
SCROTAL ULTRASOUND
DOPPLER ULTRASOUND OF THE TESTICLES
TECHNIQUE: Complete ultrasound examination of the testicles, epididymis, and
other scrotal structures was performed. Color and spectral Doppler
ultrasound were also utilized to evaluate blood flow to the
testicles.

[Series 1: us scrotum w/ doppler complete · 14 of 66 slices shown]
[im 1/66]
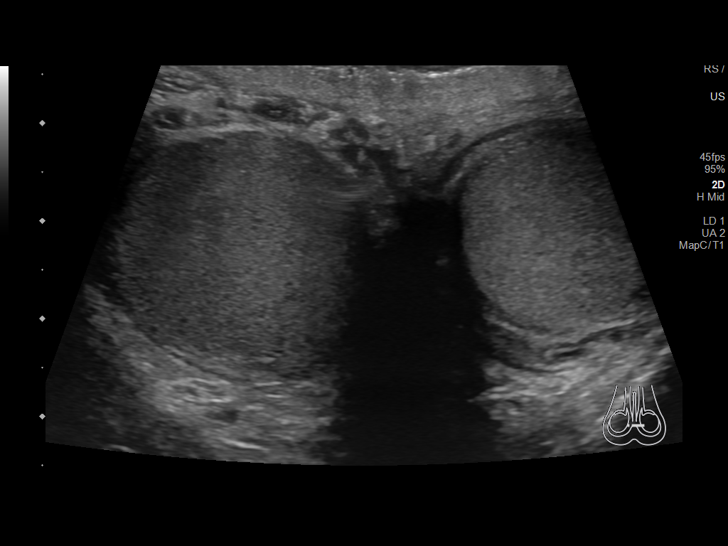
[im 6/66]
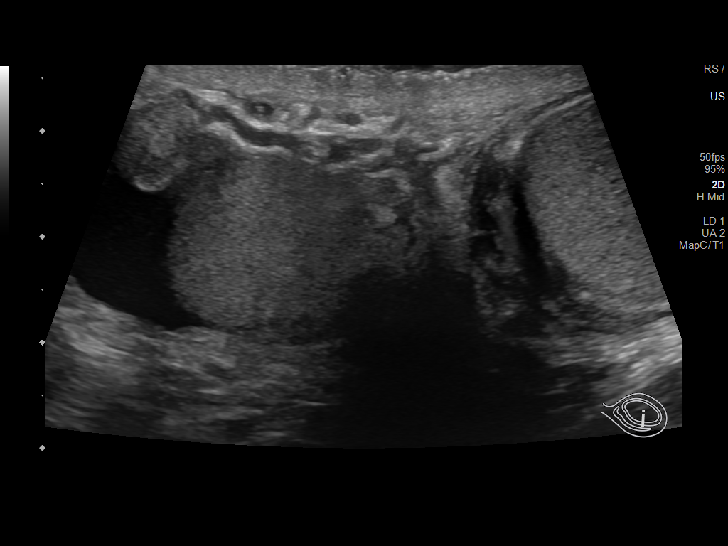
[im 11/66]
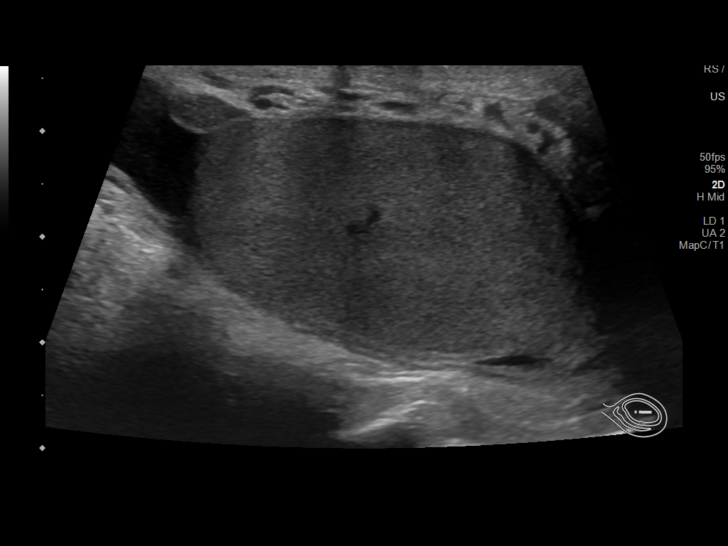
[im 17/66]
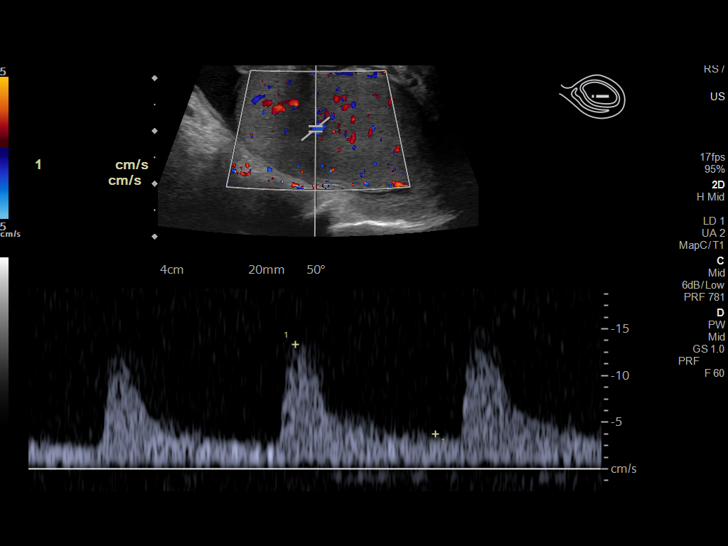
[im 22/66]
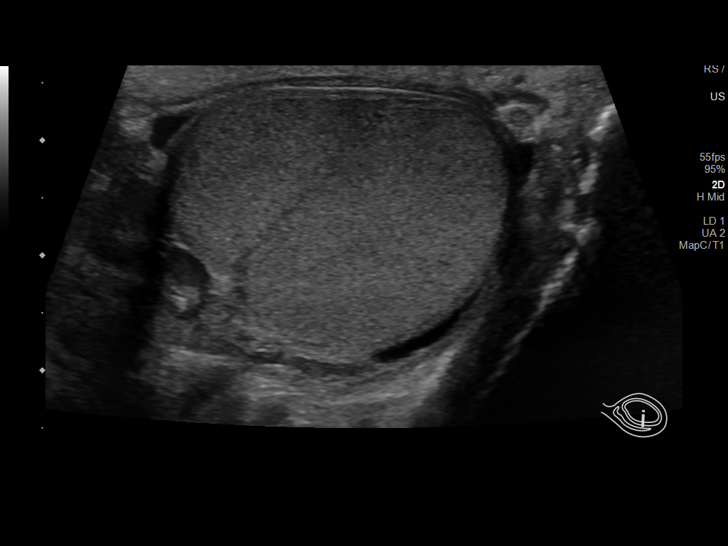
[im 25/66]
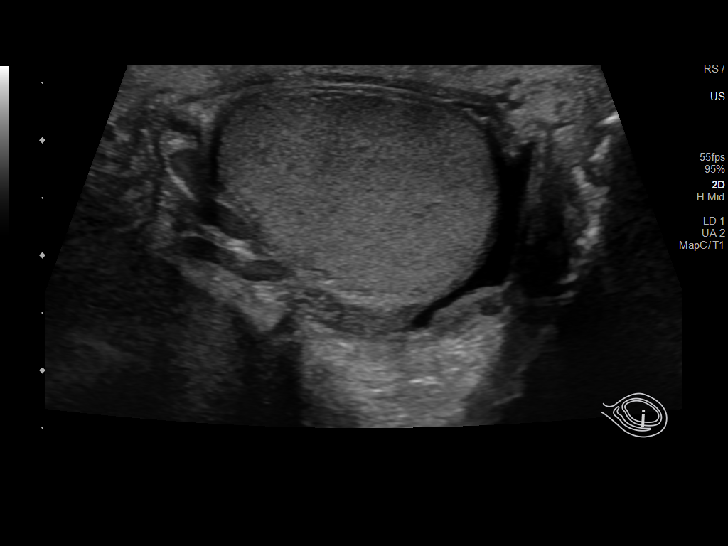
[im 30/66]
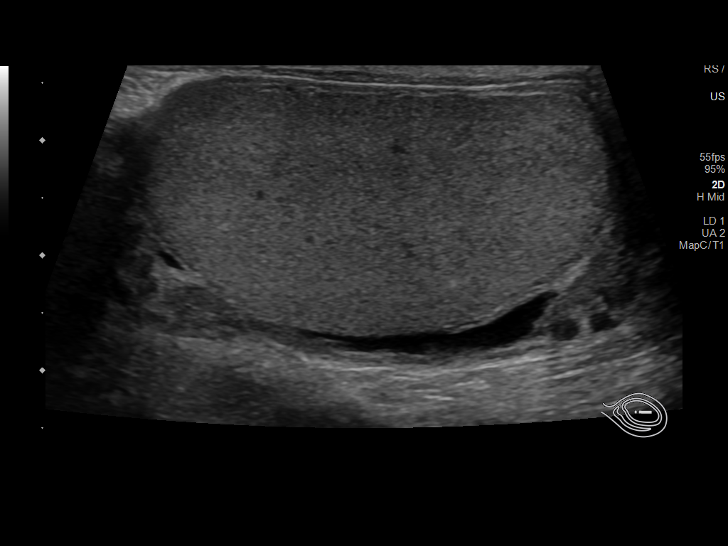
[im 36/66]
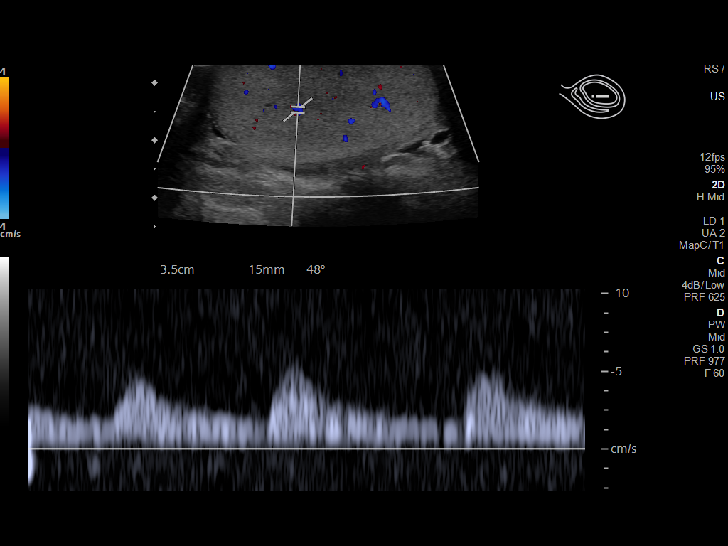
[im 41/66]
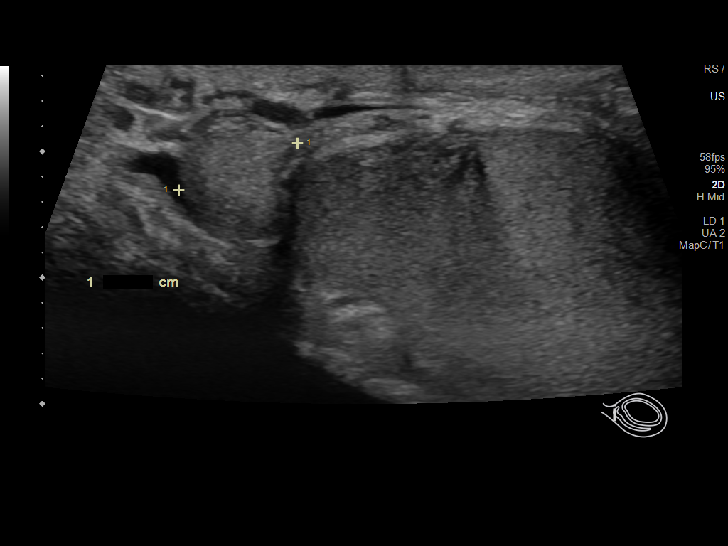
[im 44/66]
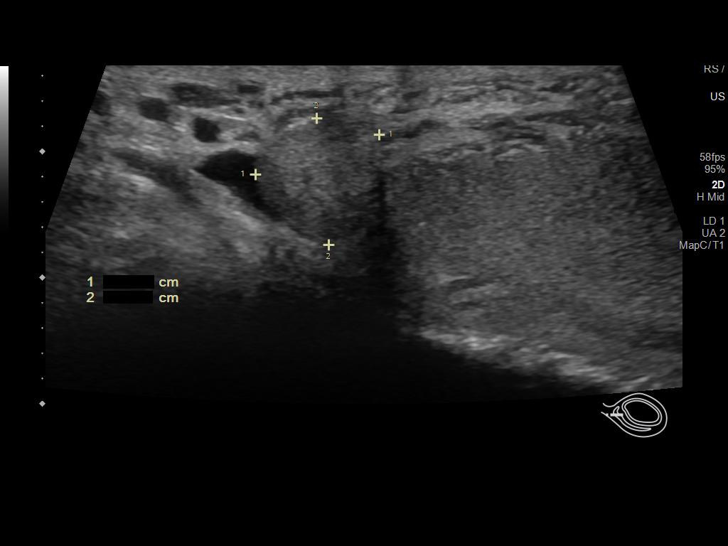
[im 49/66]
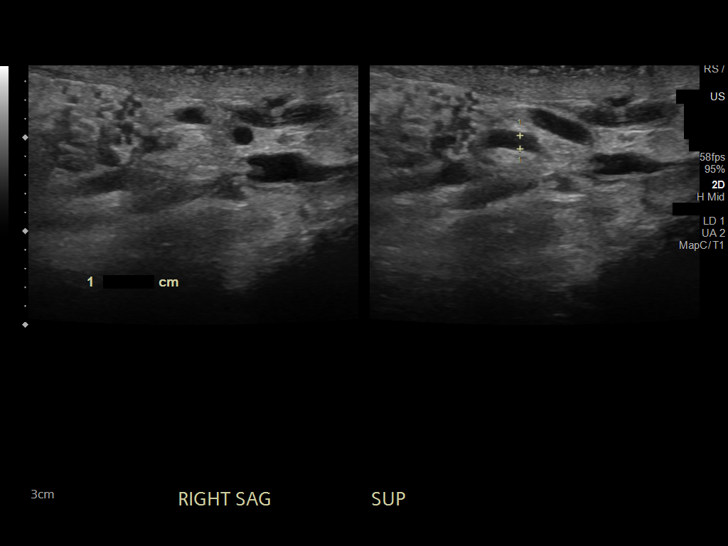
[im 55/66]
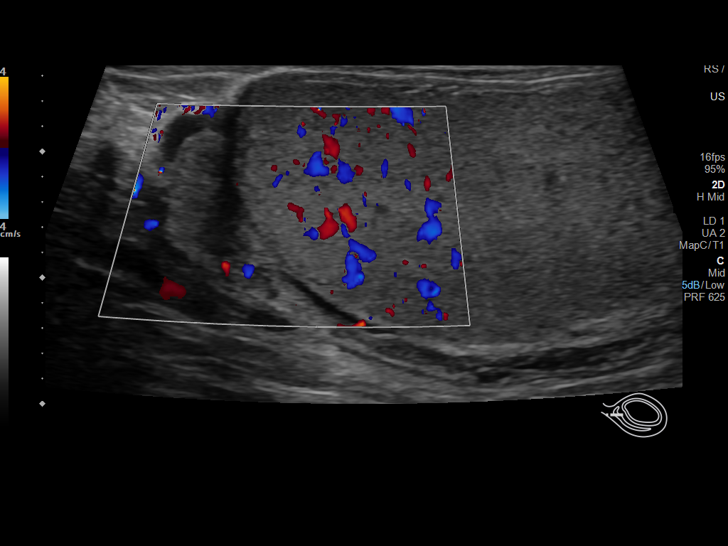
[im 60/66]
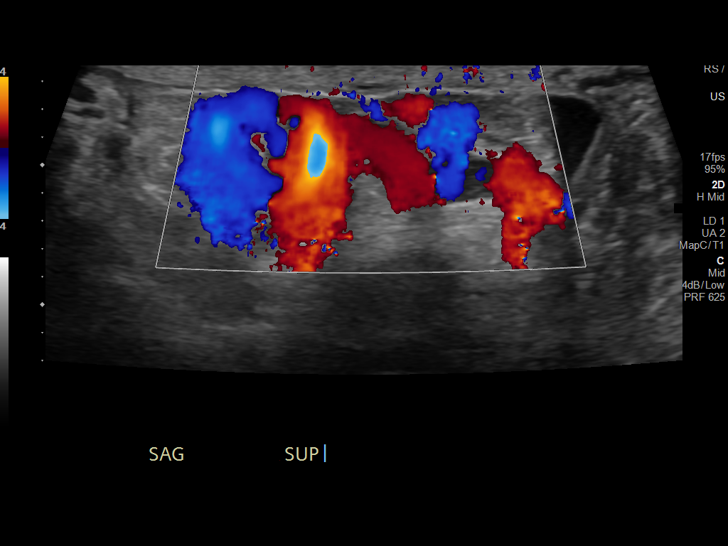
[im 66/66]
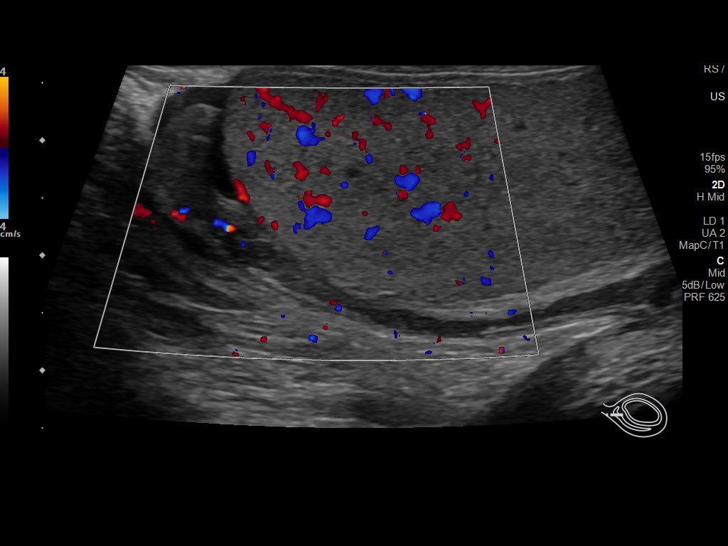

[14 of 25 positions shown; findings below may reference images not displayed]

FINDINGS: Right testicle

Measurements: 4.1 x 2.3 x 2.5 cm. No mass or microlithiasis
visualized.

Left testicle

Measurements: 4.1 x 2.2 x 2.9 cm. No mass or microlithiasis
visualized.

Right epididymis:  Normal in size and appearance.

Left epididymis:  Normal in size and appearance.

Hydrocele:  Trace bilateral hydroceles.

Varicocele:  Small left varicocele.

Pulsed Doppler interrogation of both testes demonstrates normal low
resistance arterial and venous waveforms bilaterally.
IMPRESSION: 1. Negative for testicular torsion or intratesticular mass lesion.
2. Small left varicocele
3. Trace bilateral hydroceles

## 2021-05-18 ENCOUNTER — Ambulatory Visit: Payer: Medicare Other | Admitting: General Surgery

## 2021-05-18 ENCOUNTER — Encounter: Payer: Self-pay | Admitting: General Surgery

## 2021-05-18 ENCOUNTER — Other Ambulatory Visit: Payer: Self-pay

## 2021-05-18 VITALS — BP 162/83 | HR 75 | Temp 98.7°F | Resp 14 | Ht 69.0 in | Wt 230.0 lb

## 2021-05-18 DIAGNOSIS — K429 Umbilical hernia without obstruction or gangrene: Secondary | ICD-10-CM | POA: Diagnosis not present

## 2021-05-18 NOTE — Progress Notes (Signed)
Charles Brandt; [id number]; Nov 30, 1956   HPI Patient is a 65 year old white male who was referred to my care by Cheron Every for evaluation treatment of an umbilical hernia.  He has had an umbilical hernia for many years.  He has never had an episode of incarceration.  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  He has never had surgery in that area. Past Medical History:  Diagnosis Date  . Hyperlipidemia   . Hypertension     Past Surgical History:  Procedure Laterality Date  . COLONOSCOPY    . COLONOSCOPY N/A 11/30/2015   Procedure: COLONOSCOPY;  Surgeon: Corbin Ade, MD;  [facility];  Service: Endoscopy;  Laterality: N/A;  9:15 Am - moved to 12/14 @ 10:30  . TONSILLECTOMY      History reviewed. No pertinent family history.  Current Outpatient Medications on File Prior to Visit  Medication Sig Dispense Refill  . co-enzyme Q-10 30 MG capsule Take 30 mg by mouth 3 (three) times daily.    . fenofibrate (TRICOR) 145 MG tablet Take 145 mg by mouth daily.    Marland Kitchen lisinopril (ZESTRIL) 20 MG tablet Take 1 tablet by mouth daily.    . meloxicam (MOBIC) 15 MG tablet Take 1 tablet by mouth daily.    . metFORMIN (GLUCOPHAGE-XR) 500 MG 24 hr tablet SMARTSIG:2 Tablet(s) By Mouth Every Evening     No current facility-administered medications on file prior to visit.    No Known Allergies  Social History   Substance and Sexual Activity  Alcohol Use Yes   Comment: Rare    Social History   Tobacco Use  Smoking Status Never Smoker  Smokeless Tobacco Never Used    Review of Systems  Constitutional: Positive for malaise/fatigue.  HENT: Negative.   Eyes: Negative.   Respiratory: Negative.   Cardiovascular: Negative.   Gastrointestinal: Negative.   Genitourinary: Positive for frequency.  Musculoskeletal: Negative.   Skin: Negative.   Neurological: Negative.   Endo/Heme/Allergies:  Negative.   Psychiatric/Behavioral: Negative.     Objective   Vitals:   05/18/21 1003  BP: (!) 162/83  Pulse: 75  Resp: 14  Temp: 98.7 F (37.1 C)  SpO2: 97%    Physical Exam Vitals reviewed.  Constitutional:      Appearance: Normal appearance. He is not ill-appearing.  HENT:     Head: Normocephalic and atraumatic.  Cardiovascular:     Rate and Rhythm: Normal rate and regular rhythm.     Heart sounds: Normal heart sounds. No murmur heard. No friction rub. No gallop.   Pulmonary:     Effort: Pulmonary effort is normal. No respiratory distress.     Breath sounds: Normal breath sounds. No stridor. No wheezing, rhonchi or rales.  Abdominal:     General: Bowel sounds are normal. There is no distension.     Palpations: Abdomen is soft. There is no mass.     Tenderness: There is no abdominal tenderness. There is no guarding or rebound.     Hernia: A hernia is present.     Comments: Large easily reducible umbilical hernia with thinning skin overlying it.  Skin:    General: Skin is warm and dry.  Neurological:     Mental Status: He is alert and oriented to person, place, and time.    Primary care notes reviewed Assessment  Umbilical hernia Plan   Patient will call  to schedule an umbilical herniorrhaphy with mesh.  The risks and benefits of the procedure including bleeding, infection, mesh use, and the possibility of recurrence of the hernia were fully explained to the patient, who gave informed consent.  The signs and symptoms of incarceration were explained to the patient.

## 2021-05-18 NOTE — Patient Instructions (Signed)

## 2022-04-10 ENCOUNTER — Encounter: Payer: Self-pay | Admitting: General Surgery

## 2022-04-10 ENCOUNTER — Ambulatory Visit: Payer: Medicare Other | Admitting: General Surgery

## 2022-04-10 ENCOUNTER — Other Ambulatory Visit: Payer: Self-pay

## 2022-04-10 VITALS — BP 170/80 | HR 70 | Temp 97.9°F | Resp 12 | Ht 69.0 in | Wt 222.0 lb

## 2022-04-10 DIAGNOSIS — K429 Umbilical hernia without obstruction or gangrene: Secondary | ICD-10-CM

## 2022-04-10 NOTE — Progress Notes (Signed)
Charles Brandt; [id number]; 02/17/1956   HPI Patient is a 66-year-old white male who returns to my care for evaluation and treatment of an umbilical hernia.  I last saw him in my office in June 2022.  He had an umbilical hernia at that time.  He stated he wanted to call me and decide on whether to have surgical repair.  He now returns with the hernia somewhat increasing in size and causing him discomfort.  He denies any nausea or vomiting.  He has not had an episode of incarceration. Past Medical History:  Diagnosis Date   Hyperlipidemia    Hypertension     Past Surgical History:  Procedure Laterality Date   COLONOSCOPY     COLONOSCOPY N/A 11/30/2015   Procedure: COLONOSCOPY;  Surgeon: Robert M Rourk, MD;  Location: [facility];  Service: Endoscopy;  Laterality: N/A;  9:15 Am - moved to 12/14 @ 10:30   TONSILLECTOMY      History reviewed. No pertinent family history.  Current Outpatient Medications on File Prior to Visit  Medication Sig Dispense Refill   co-enzyme Q-10 30 MG capsule Take 30 mg by mouth 3 (three) times daily.     fenofibrate (TRICOR) 145 MG tablet Take 145 mg by mouth daily.     lisinopril (ZESTRIL) 20 MG tablet Take 1 tablet by mouth daily.     meloxicam (MOBIC) 15 MG tablet Take 1 tablet by mouth daily.     metFORMIN (GLUCOPHAGE-XR) 500 MG 24 hr tablet SMARTSIG:2 Tablet(s) By Mouth Every Evening     Misc Natural Products (OSTEO BI-FLEX JOINT SHIELD) TABS      Omega-3 Fatty Acids (FISH OIL) 1200 MG CPDR Take by mouth.     No current facility-administered medications on file prior to visit.    No Known Allergies  Social History   Substance and Sexual Activity  Alcohol Use Yes   Comment: Rare    Social History   Tobacco Use  Smoking Status Never  Smokeless Tobacco Never    Review of Systems  Constitutional: Negative.   HENT: Negative.    Eyes: Negative.   Respiratory: Negative.    Cardiovascular: Negative.   Gastrointestinal: Negative.    Genitourinary: Negative.   Musculoskeletal:  Positive for joint pain.  Skin: Negative.   Neurological: Negative.   Endo/Heme/Allergies: Negative.   Psychiatric/Behavioral: Negative.     Objective   Vitals:   04/10/22 1334  BP: (!) 170/80  Pulse: 70  Resp: 12  Temp: 97.9 F (36.6 C)  SpO2: 97%    Physical Exam Vitals reviewed.  Constitutional:      Appearance: Normal appearance.  HENT:     Head: Normocephalic and atraumatic.  Cardiovascular:     Rate and Rhythm: Normal rate and regular rhythm.     Heart sounds: Normal heart sounds. No murmur heard.   No friction rub. No gallop.  Pulmonary:     Effort: Pulmonary effort is normal. No respiratory distress.     Breath sounds: Normal breath sounds. No stridor. No wheezing, rhonchi or rales.  Abdominal:     General: Bowel sounds are normal. There is no distension.     Palpations: Abdomen is soft. There is no mass.     Tenderness: There is no abdominal tenderness. There is no guarding or rebound.     Hernia: A hernia is present.     Comments: Easily reducible protuberant 4 to 5 cm umbilical hernia with excess umbilical skin present.  Lymphadenopathy:       Cervical: Cervical adenopathy present.  ?Skin: ?   General: Skin is warm and dry.  ?Neurological:  ?   Mental Status: He is alert and oriented to person, place, and time.  ? ?Previous office notes reviewed ?Assessment  ?Umbilical hernia ?Plan  ?Patient will call to schedule an umbilical herniorrhaphy with mesh.  I also told him I would excise some excess umbilical skin.  The risks and benefits of the procedure including bleeding, infection, mesh use, and the possibility of recurrence of the hernia were fully explained to the patient, who gave informed consent. ?

## 2022-05-29 NOTE — H&P (Signed)
Charles Brandt; [id number]; 1956/11/13   HPI Patient is a 66 year old white male who returns to my care for evaluation and treatment of an umbilical hernia.  I last saw him in my office in June 2022.  He had an umbilical hernia at that time.  He stated he wanted to call me and decide on whether to have surgical repair.  He now returns with the hernia somewhat increasing in size and causing him discomfort.  He denies any nausea or vomiting.  He has not had an episode of incarceration. Past Medical History:  Diagnosis Date   Hyperlipidemia    Hypertension     Past Surgical History:  Procedure Laterality Date   COLONOSCOPY     COLONOSCOPY N/A 11/30/2015   Procedure: COLONOSCOPY;  Surgeon: Corbin Ade, MD;  Location: [facility];  Service: Endoscopy;  Laterality: N/A;  9:15 Am - moved to 12/14 @ 10:30   TONSILLECTOMY      History reviewed. No pertinent family history.  Current Outpatient Medications on File Prior to Visit  Medication Sig Dispense Refill   co-enzyme Q-10 30 MG capsule Take 30 mg by mouth 3 (three) times daily.     fenofibrate (TRICOR) 145 MG tablet Take 145 mg by mouth daily.     lisinopril (ZESTRIL) 20 MG tablet Take 1 tablet by mouth daily.     meloxicam (MOBIC) 15 MG tablet Take 1 tablet by mouth daily.     metFORMIN (GLUCOPHAGE-XR) 500 MG 24 hr tablet SMARTSIG:2 Tablet(s) By Mouth Every Evening     Misc Natural Products (OSTEO BI-FLEX JOINT SHIELD) TABS      Omega-3 Fatty Acids (FISH OIL) 1200 MG CPDR Take by mouth.     No current facility-administered medications on file prior to visit.    No Known Allergies  Social History   Substance and Sexual Activity  Alcohol Use Yes   Comment: Rare    Social History   Tobacco Use  Smoking Status Never  Smokeless Tobacco Never    Review of Systems  Constitutional: Negative.   HENT: Negative.    Eyes: Negative.   Respiratory: Negative.    Cardiovascular: Negative.   Gastrointestinal: Negative.    Genitourinary: Negative.   Musculoskeletal:  Positive for joint pain.  Skin: Negative.   Neurological: Negative.   Endo/Heme/Allergies: Negative.   Psychiatric/Behavioral: Negative.     Objective   Vitals:   04/10/22 1334  BP: (!) 170/80  Pulse: 70  Resp: 12  Temp: 97.9 F (36.6 C)  SpO2: 97%    Physical Exam Vitals reviewed.  Constitutional:      Appearance: Normal appearance.  HENT:     Head: Normocephalic and atraumatic.  Cardiovascular:     Rate and Rhythm: Normal rate and regular rhythm.     Heart sounds: Normal heart sounds. No murmur heard.   No friction rub. No gallop.  Pulmonary:     Effort: Pulmonary effort is normal. No respiratory distress.     Breath sounds: Normal breath sounds. No stridor. No wheezing, rhonchi or rales.  Abdominal:     General: Bowel sounds are normal. There is no distension.     Palpations: Abdomen is soft. There is no mass.     Tenderness: There is no abdominal tenderness. There is no guarding or rebound.     Hernia: A hernia is present.     Comments: Easily reducible protuberant 4 to 5 cm umbilical hernia with excess umbilical skin present.  Lymphadenopathy:  Cervical: Cervical adenopathy present.  Skin:    General: Skin is warm and dry.  Neurological:     Mental Status: He is alert and oriented to person, place, and time.   Previous office notes reviewed Assessment  Umbilical hernia Plan  Patient will call to schedule an umbilical herniorrhaphy with mesh.  I also told him I would excise some excess umbilical skin.  The risks and benefits of the procedure including bleeding, infection, mesh use, and the possibility of recurrence of the hernia were fully explained to the patient, who gave informed consent.

## 2022-06-15 NOTE — Patient Instructions (Signed)
Your procedure is scheduled on: 06/22/2022  Report to [facility] Main Entrance at   6:00  AM.  Call this number if you have problems the morning of surgery: [phone]   Remember:   Do not Eat or Drink after midnight         No Smoking the morning of surgery  :  Take these medicines the morning of surgery with A SIP OF WATER: none  Do not take any diabetic medication am of surgery   Do not wear jewelry, make-up or nail polish.  Do not wear lotions, powders, or perfumes. You may wear deodorant.  Do not shave 48 hours prior to surgery. Men may shave face and neck.  Do not bring valuables to the hospital.  Contacts, dentures or bridgework may not be worn into surgery.  Leave suitcase in the car. After surgery it may be brought to your room.  For patients admitted to the hospital, checkout time is 11:00 AM the day of discharge.   Patients discharged the day of surgery will not be allowed to drive home.    Special Instructions: Shower using CHG night before surgery and shower the day of surgery use CHG.  Use special wash - you have one bottle of CHG for all showers.  You should use approximately 1/2 of the bottle for each shower.  How to Use Chlorhexidine for Bathing Chlorhexidine gluconate (CHG) is a germ-killing (antiseptic) solution that is used to clean the skin. It can get rid of the bacteria that normally live on the skin and can keep them away for about 24 hours. To clean your skin with CHG, you may be given: A CHG solution to use in the shower or as part of a sponge bath. A prepackaged cloth that contains CHG. Cleaning your skin with CHG may help lower the risk for infection: While you are staying in the intensive care unit of the hospital. If you have a vascular access, such as a central line, to provide short-term or long-term access to your veins. If you have a catheter to drain urine from your bladder. If you are on a ventilator. A ventilator is a machine that helps you  breathe by moving air in and out of your lungs. After surgery. What are the risks? Risks of using CHG include: A skin reaction. Hearing loss, if CHG gets in your ears and you have a perforated eardrum. Eye injury, if CHG gets in your eyes and is not rinsed out. The CHG product catching fire. Make sure that you avoid smoking and flames after applying CHG to your skin. Do not use CHG: If you have a chlorhexidine allergy or have previously reacted to chlorhexidine. On babies younger than 87 months of age. How to use CHG solution Use CHG only as told by your health care provider, and follow the instructions on the label. Use the full amount of CHG as directed. Usually, this is one bottle. During a shower Follow these steps when using CHG solution during a shower (unless your health care provider gives you different instructions): Start the shower. Use your normal soap and shampoo to wash your face and hair. Turn off the shower or move out of the shower stream. Pour the CHG onto a clean washcloth. Do not use any type of brush or rough-edged sponge. Starting at your neck, lather your body down to your toes. Make sure you follow these instructions: If you will be having surgery, pay special attention to the part  of your body where you will be having surgery. Scrub this area for at least 1 minute. Do not use CHG on your head or face. If the solution gets into your ears or eyes, rinse them well with water. Avoid your genital area. Avoid any areas of skin that have broken skin, cuts, or scrapes. Scrub your back and under your arms. Make sure to wash skin folds. Let the lather sit on your skin for 1-2 minutes or as long as told by your health care provider. Thoroughly rinse your entire body in the shower. Make sure that all body creases and crevices are rinsed well. Dry off with a clean towel. Do not put any substances on your body afterward--such as powder, lotion, or perfume--unless you are told  to do so by your health care provider. Only use lotions that are recommended by the manufacturer. Put on clean clothes or pajamas. If it is the night before your surgery, sleep in clean sheets.  During a sponge bath Follow these steps when using CHG solution during a sponge bath (unless your health care provider gives you different instructions): Use your normal soap and shampoo to wash your face and hair. Pour the CHG onto a clean washcloth. Starting at your neck, lather your body down to your toes. Make sure you follow these instructions: If you will be having surgery, pay special attention to the part of your body where you will be having surgery. Scrub this area for at least 1 minute. Do not use CHG on your head or face. If the solution gets into your ears or eyes, rinse them well with water. Avoid your genital area. Avoid any areas of skin that have broken skin, cuts, or scrapes. Scrub your back and under your arms. Make sure to wash skin folds. Let the lather sit on your skin for 1-2 minutes or as long as told by your health care provider. Using a different clean, wet washcloth, thoroughly rinse your entire body. Make sure that all body creases and crevices are rinsed well. Dry off with a clean towel. Do not put any substances on your body afterward--such as powder, lotion, or perfume--unless you are told to do so by your health care provider. Only use lotions that are recommended by the manufacturer. Put on clean clothes or pajamas. If it is the night before your surgery, sleep in clean sheets. How to use CHG prepackaged cloths Only use CHG cloths as told by your health care provider, and follow the instructions on the label. Use the CHG cloth on clean, dry skin. Do not use the CHG cloth on your head or face unless your health care provider tells you to. When washing with the CHG cloth: Avoid your genital area. Avoid any areas of skin that have broken skin, cuts, or scrapes. Before  surgery Follow these steps when using a CHG cloth to clean before surgery (unless your health care provider gives you different instructions): Using the CHG cloth, vigorously scrub the part of your body where you will be having surgery. Scrub using a back-and-forth motion for 3 minutes. The area on your body should be completely wet with CHG when you are done scrubbing. Do not rinse. Discard the cloth and let the area air-dry. Do not put any substances on the area afterward, such as powder, lotion, or perfume. Put on clean clothes or pajamas. If it is the night before your surgery, sleep in clean sheets.  For general bathing Follow these steps when using  CHG cloths for general bathing (unless your health care provider gives you different instructions). Use a separate CHG cloth for each area of your body. Make sure you wash between any folds of skin and between your fingers and toes. Wash your body in the following order, switching to a new cloth after each step: The front of your neck, shoulders, and chest. Both of your arms, under your arms, and your hands. Your stomach and groin area, avoiding the genitals. Your right leg and foot. Your left leg and foot. The back of your neck, your back, and your buttocks. Do not rinse. Discard the cloth and let the area air-dry. Do not put any substances on your body afterward--such as powder, lotion, or perfume--unless you are told to do so by your health care provider. Only use lotions that are recommended by the manufacturer. Put on clean clothes or pajamas. Contact a health care provider if: Your skin gets irritated after scrubbing. You have questions about using your solution or cloth. You swallow any chlorhexidine. Call your local poison control center [phone] in the [place] Get help right away if: Your eyes itch badly, or they become very red or swollen. Your skin itches badly and is red or swollen. Your hearing changes. You have trouble  seeing. You have swelling or tingling in your mouth or throat. You have trouble breathing. These symptoms may represent a serious problem that is an emergency. Do not wait to see if the symptoms will go away. Get medical help right away. Call your local emergency services (911 in the [place] Do not drive yourself to the hospital. Summary Chlorhexidine gluconate (CHG) is a germ-killing (antiseptic) solution that is used to clean the skin. Cleaning your skin with CHG may help to lower your risk for infection. You may be given CHG to use for bathing. It may be in a bottle or in a prepackaged cloth to use on your skin. Carefully follow your health care provider's instructions and the instructions on the product label. Do not use CHG if you have a chlorhexidine allergy. Contact your health care provider if your skin gets irritated after scrubbing. This information is not intended to replace advice given to you by your health care provider. Make sure you discuss any questions you have with your health care provider. Document Revised: 02/13/2021 Document Reviewed: 02/13/2021 Elsevier Patient Education  2023 [employer] Umbilical Hernia, Adult  A hernia is a bulge of tissue that pushes through an opening between muscles. An umbilical hernia happens in the abdomen, near the belly button (umbilicus). The hernia may contain tissues from the small intestine, large intestine, or fatty tissue covering the intestines. Umbilical hernias in adults tend to get worse over time, and they require surgical treatment. There are different types of umbilical hernias, including: Indirect hernia. This type is located just above or below the umbilicus. It is the most common type of umbilical hernia in adults. Direct hernia. This type forms through an opening formed by the umbilicus. Reducible hernia. This type of hernia comes and goes. It may be visible only when you strain, lift something heavy, or cough. This type of  hernia can be pushed back into the abdomen (reduced). Incarcerated hernia. This type traps abdominal tissue inside the hernia. This type of hernia cannot be reduced. Strangulated hernia. This type of hernia cuts off blood flow to the tissues inside the hernia. The tissues can start to die if this happens. This type of hernia requires emergency treatment. What are  the causes? An umbilical hernia happens when tissue inside the abdomen presses on a weak area of the abdominal muscles. What increases the risk? You may have a greater risk of this condition if you: Are obese. Have had several pregnancies. Have a buildup of fluid inside your abdomen. Have had surgery that weakens the abdominal muscles. What are the signs or symptoms? The main symptom of this condition is a painless bulge at or near the belly button. A reducible hernia may be visible only when you strain, lift something heavy, or cough. Other symptoms may include: Dull pain. A feeling of pressure. Symptoms of a strangulated hernia may include: Pain that gets increasingly worse. Nausea and vomiting. Pain when pressing on the hernia. Skin over the hernia becoming red or purple. Constipation. Blood in the stool. How is this diagnosed? This condition may be diagnosed based on: A physical exam. You may be asked to cough or strain while standing. These actions increase the pressure inside your abdomen and can force the hernia through the opening in your muscles. Your health care provider may try to reduce the hernia by pressing on it. Your symptoms and medical history. How is this treated? Surgery is the only treatment for an umbilical hernia. Surgery for a strangulated hernia is done as soon as possible. If you have a small hernia that is not incarcerated, you may need to lose weight before having surgery. Follow these instructions at home: Lose weight, if told by your health care provider. Do not try to push the hernia back  in. Watch your hernia for any changes in color or size. Tell your health care provider if any changes occur. You may need to avoid activities that increase pressure on your hernia. Do not lift anything that is heavier than 10 lb (4.5 kg), or the limit that you are told, until your health care provider says that it is safe. Take over-the-counter and prescription medicines only as told by your health care provider. Keep all follow-up visits. This is important. Contact a health care provider if: Your hernia gets larger. Your hernia becomes painful. Get help right away if: You develop sudden, severe pain near the area of your hernia. You have pain as well as nausea or vomiting. You have pain and the skin over your hernia changes color. You develop a fever or chills. Summary A hernia is a bulge of tissue that pushes through an opening between muscles. An umbilical hernia happens near the belly button. Surgery is the only treatment for an umbilical hernia. Do not try to push your hernia back in. Keep all follow-up visits. This is important. This information is not intended to replace advice given to you by your health care provider. Make sure you discuss any questions you have with your health care provider. Document Revised: 07/11/2020 Document Reviewed: 07/11/2020 Elsevier Patient Education  2023 [employer] Inc. Open Hernia Repair, Adult, Care After What can I expect after the procedure? After the procedure, it is common to have: Mild discomfort. Slight bruising. Mild swelling. Pain in the belly (abdomen). A small amount of blood from the cut from surgery (incision). Follow these instructions at home: Your doctor may give you more specific instructions. If you have problems, call your doctor. Medicines Take over-the-counter and prescription medicines only as told by your doctor. If told, take steps to prevent problems with pooping (constipation). You may need to: Drink enough fluid to  keep your pee (urine) pale yellow. Take medicines. You will be told what  medicines to take. Eat foods that are high in fiber. These include beans, whole grains, and fresh fruits and vegetables. Limit foods that are high in fat and sugar. These include fried or sweet foods. Ask your doctor if you should avoid driving or using machines while you are taking your medicine. Incision care  Follow instructions from your doctor about how to take care of your incision. Make sure you: Wash your hands with soap and water for at least 20 seconds before and after you change your bandage (dressing). If you cannot use soap and water, use hand sanitizer. Change your bandage. Leave stitches or skin glue in place for at least 2 weeks. Leave tape strips alone unless you are told to take them off. You may trim the edges of the tape strips if they curl up. Check your incision every day for signs of infection. Check for: More redness, swelling, or pain. More fluid or blood. Warmth. Pus or a bad smell. Wear loose, soft clothing while your incision heals. Activity  Rest as told by your doctor. Do not lift anything that is heavier than 10 lb (4.5 kg), or the limit that you are told. Do not play contact sports until your doctor says that this is safe. If you were given a sedative during your procedure, do not drive or use machines until your doctor says that it is safe. A sedative is a medicine that helps you relax. Return to your normal activities when your doctor says that it is safe. General instructions Do not take baths, swim, or use a hot tub. Ask your doctor about taking showers or sponge baths. Hold a pillow over your belly when you cough or sneeze. This helps with pain. Do not smoke or use any products that contain nicotine or tobacco. If you need help quitting, ask your doctor. Keep all follow-up visits. Contact a doctor if: You have any of these signs of infection in or around your incision: More  redness, swelling, or pain. More fluid or blood. Warmth. Pus. A bad smell. You have a fever or chills. You have blood in your poop (stool). You have not pooped (had a bowel movement) in 2-3 days. Medicine does not help your pain. Get help right away if: You have chest pain, or you are short of breath. You feel faint or light-headed. You have very bad pain. You vomit and your pain is worse. You have pain, swelling, or redness in a leg. These symptoms may be an emergency. Get help right away. Call your local emergency services (911 in the [place] Do not wait to see if the symptoms will go away. Do not drive yourself to the hospital. Summary After this procedure, it is common to have mild discomfort, slight bruising, and mild swelling. Follow instructions from your doctor about how to take care of your cut from surgery (incision). Check every day for signs of infection. Do not lift heavy objects or play contact sports until your doctor says it is safe. Return to your normal activities as told by your doctor. This information is not intended to replace advice given to you by your health care provider. Make sure you discuss any questions you have with your health care provider. Document Revised: 07/18/2020 Document Reviewed: 07/18/2020 Elsevier Patient Education  2023 [employer] Inc. General Anesthesia, Adult, Care After This sheet gives you information about how to care for yourself after your procedure. Your health care provider may also give you more specific instructions. If you have  problems or questions, contact your health care provider. What can I expect after the procedure? After the procedure, the following side effects are common: Pain or discomfort at the IV site. Nausea. Vomiting. Sore throat. Trouble concentrating. Feeling cold or chills. Feeling weak or tired. Sleepiness and fatigue. Soreness and body aches. These side effects can affect parts of the body that were not  involved in surgery. Follow these instructions at home: For the time period you were told by your health care provider:  Rest. Do not participate in activities where you could fall or become injured. Do not drive or use machinery. Do not drink alcohol. Do not take sleeping pills or medicines that cause drowsiness. Do not make important decisions or sign legal documents. Do not take care of children on your own. Eating and drinking Follow any instructions from your health care provider about eating or drinking restrictions. When you feel hungry, start by eating small amounts of foods that are soft and easy to digest (bland), such as toast. Gradually return to your regular diet. Drink enough fluid to keep your urine pale yellow. If you vomit, rehydrate by drinking water, juice, or clear broth. General instructions If you have sleep apnea, surgery and certain medicines can increase your risk for breathing problems. Follow instructions from your health care provider about wearing your sleep device: Anytime you are sleeping, including during daytime naps. While taking prescription pain medicines, sleeping medicines, or medicines that make you drowsy. Have a responsible adult stay with you for the time you are told. It is important to have someone help care for you until you are awake and alert. Return to your normal activities as told by your health care provider. Ask your health care provider what activities are safe for you. Take over-the-counter and prescription medicines only as told by your health care provider. If you smoke, do not smoke without supervision. Keep all follow-up visits as told by your health care provider. This is important. Contact a health care provider if: You have nausea or vomiting that does not get better with medicine. You cannot eat or drink without vomiting. You have pain that does not get better with medicine. You are unable to pass urine. You develop a skin  rash. You have a fever. You have redness around your IV site that gets worse. Get help right away if: You have difficulty breathing. You have chest pain. You have blood in your urine or stool, or you vomit blood. Summary After the procedure, it is common to have a sore throat or nausea. It is also common to feel tired. Have a responsible adult stay with you for the time you are told. It is important to have someone help care for you until you are awake and alert. When you feel hungry, start by eating small amounts of foods that are soft and easy to digest (bland), such as toast. Gradually return to your regular diet. Drink enough fluid to keep your urine pale yellow. Return to your normal activities as told by your health care provider. Ask your health care provider what activities are safe for you. This information is not intended to replace advice given to you by your health care provider. Make sure you discuss any questions you have with your health care provider. Document Revised: 08/18/2020 Document Reviewed: 03/17/2020 Elsevier Patient Education  2023 [employer].

## 2022-06-20 ENCOUNTER — Encounter (HOSPITAL_COMMUNITY)
Admission: RE | Admit: 2022-06-20 | Discharge: 2022-06-20 | Disposition: A | Discharge status: Home / Self Care | Payer: Medicare Other | Source: Ambulatory Visit | Attending: General Surgery | Admitting: General Surgery

## 2022-06-20 ENCOUNTER — Other Ambulatory Visit (HOSPITAL_COMMUNITY): Payer: Medicare Other

## 2022-06-20 ENCOUNTER — Encounter (HOSPITAL_COMMUNITY): Payer: Self-pay

## 2022-06-20 VITALS — BP 138/80 | HR 70 | Temp 97.7°F | Ht 69.0 in | Wt 216.0 lb

## 2022-06-20 DIAGNOSIS — T502X5A Adverse effect of carbonic-anhydrase inhibitors, benzothiadiazides and other diuretics, initial encounter: Secondary | ICD-10-CM | POA: Insufficient documentation

## 2022-06-20 DIAGNOSIS — E119 Type 2 diabetes mellitus without complications: Secondary | ICD-10-CM | POA: Diagnosis not present

## 2022-06-20 DIAGNOSIS — Z794 Long term (current) use of insulin: Secondary | ICD-10-CM | POA: Insufficient documentation

## 2022-06-20 DIAGNOSIS — Z01818 Encounter for other preprocedural examination: Secondary | ICD-10-CM | POA: Diagnosis present

## 2022-06-20 DIAGNOSIS — I10 Essential (primary) hypertension: Secondary | ICD-10-CM | POA: Diagnosis not present

## 2022-06-20 HISTORY — DX: Type 2 diabetes mellitus without complications: E11.9

## 2022-06-20 LAB — BASIC METABOLIC PANEL
Anion gap: 5 (ref 5–15)
BUN: 29 mg/dL — ABNORMAL HIGH (ref 8–23)
CO2: 28 mmol/L (ref 22–32)
Calcium: 9.6 mg/dL (ref 8.9–10.3)
Chloride: 104 mmol/L (ref 98–111)
Creatinine, Ser: 0.95 mg/dL (ref 0.61–1.24)
GFR, Estimated: >60 mL/min (ref >60)
Glucose, Bld: 217 mg/dL — ABNORMAL HIGH (ref 70–99)
Potassium: 4.1 mmol/L (ref 3.5–5.1)
Sodium: 137 mmol/L (ref 135–145)

## 2022-06-20 LAB — HEMOGLOBIN A1C
Hgb A1c MFr Bld: 7.6 % — ABNORMAL HIGH (ref 4.8–5.6)
Mean Plasma Glucose: 171.42 mg/dL

## 2022-06-22 ENCOUNTER — Ambulatory Visit (HOSPITAL_BASED_OUTPATIENT_CLINIC_OR_DEPARTMENT_OTHER): Payer: Medicare Other | Admitting: Anesthesiology

## 2022-06-22 ENCOUNTER — Encounter (HOSPITAL_COMMUNITY): Payer: Self-pay | Admitting: General Surgery

## 2022-06-22 ENCOUNTER — Ambulatory Visit (HOSPITAL_COMMUNITY)
Admission: RE | Admit: 2022-06-22 | Discharge: 2022-06-22 | Disposition: A | Discharge status: Home / Self Care | Payer: Medicare Other | Attending: General Surgery | Admitting: General Surgery

## 2022-06-22 ENCOUNTER — Encounter (HOSPITAL_COMMUNITY)
Admission: RE | Disposition: A | Discharge status: Home / Self Care | Payer: Self-pay | Source: Home / Self Care | Attending: General Surgery

## 2022-06-22 ENCOUNTER — Ambulatory Visit (HOSPITAL_COMMUNITY): Payer: Medicare Other | Admitting: Anesthesiology

## 2022-06-22 ENCOUNTER — Other Ambulatory Visit: Payer: Self-pay

## 2022-06-22 DIAGNOSIS — E119 Type 2 diabetes mellitus without complications: Secondary | ICD-10-CM | POA: Insufficient documentation

## 2022-06-22 DIAGNOSIS — I10 Essential (primary) hypertension: Secondary | ICD-10-CM | POA: Diagnosis not present

## 2022-06-22 DIAGNOSIS — Z7984 Long term (current) use of oral hypoglycemic drugs: Secondary | ICD-10-CM | POA: Diagnosis not present

## 2022-06-22 DIAGNOSIS — Z79899 Other long term (current) drug therapy: Secondary | ICD-10-CM | POA: Insufficient documentation

## 2022-06-22 DIAGNOSIS — K429 Umbilical hernia without obstruction or gangrene: Secondary | ICD-10-CM

## 2022-06-22 HISTORY — PX: UMBILICAL HERNIA REPAIR: SHX196

## 2022-06-22 LAB — GLUCOSE, CAPILLARY: Glucose-Capillary: 191 mg/dL — ABNORMAL HIGH (ref 70–99)

## 2022-06-22 SURGERY — REPAIR, HERNIA, UMBILICAL, ADULT
Anesthesia: General | Site: Abdomen

## 2022-06-22 MED ORDER — DEXAMETHASONE SODIUM PHOSPHATE 4 MG/ML IJ SOLN
INTRAMUSCULAR | Status: DC | PRN
  Administered 2022-06-22: 5 mg via INTRAVENOUS

## 2022-06-22 MED ORDER — BUPIVACAINE LIPOSOME 1.3 % IJ SUSP
INTRAMUSCULAR | Status: AC
  Filled 2022-06-22: qty 20

## 2022-06-22 MED ORDER — ONDANSETRON HCL 4 MG/2ML IJ SOLN
4.0000 mg | Freq: Once | INTRAMUSCULAR | Status: DC | PRN
Start: 2022-06-22 — End: 2022-06-22

## 2022-06-22 MED ORDER — ROCURONIUM BROMIDE 10 MG/ML (PF) SYRINGE
PREFILLED_SYRINGE | INTRAVENOUS | Status: DC | PRN
  Administered 2022-06-22: 50 mg via INTRAVENOUS

## 2022-06-22 MED ORDER — MIDAZOLAM HCL 2 MG/2ML IJ SOLN
INTRAMUSCULAR | Status: AC
  Filled 2022-06-22: qty 2

## 2022-06-22 MED ORDER — PROPOFOL 10 MG/ML IV BOLUS
INTRAVENOUS | Status: DC | PRN
  Administered 2022-06-22: 200 mg via INTRAVENOUS

## 2022-06-22 MED ORDER — ORAL CARE MOUTH RINSE: 15.0000 mL | Freq: Once | OROMUCOSAL | Status: AC

## 2022-06-22 MED ORDER — MEPERIDINE HCL 50 MG/ML IJ SOLN: 6.2500 mg | INTRAMUSCULAR | Status: DC | PRN

## 2022-06-22 MED ORDER — BUPIVACAINE LIPOSOME 1.3 % IJ SUSP
INTRAMUSCULAR | Status: DC | PRN
  Administered 2022-06-22: 20 mL

## 2022-06-22 MED ORDER — ROCURONIUM BROMIDE 10 MG/ML (PF) SYRINGE
PREFILLED_SYRINGE | INTRAVENOUS | Status: AC
  Filled 2022-06-22: qty 10

## 2022-06-22 MED ORDER — HYDROMORPHONE HCL 1 MG/ML IJ SOLN
0.2500 mg | INTRAMUSCULAR | Status: DC | PRN
  Administered 2022-06-22 (×2): 0.5 mg via INTRAVENOUS
  Filled 2022-06-22 (×2): qty 0.5

## 2022-06-22 MED ORDER — SUGAMMADEX SODIUM 200 MG/2ML IV SOLN
INTRAVENOUS | Status: DC | PRN
  Administered 2022-06-22: 200 mg via INTRAVENOUS

## 2022-06-22 MED ORDER — CHLORHEXIDINE GLUCONATE CLOTH 2 % EX PADS: 6.0000 | MEDICATED_PAD | Freq: Once | CUTANEOUS | Status: DC

## 2022-06-22 MED ORDER — FENTANYL CITRATE (PF) 100 MCG/2ML IJ SOLN
INTRAMUSCULAR | Status: AC
  Filled 2022-06-22: qty 2

## 2022-06-22 MED ORDER — KETOROLAC TROMETHAMINE 30 MG/ML IJ SOLN
INTRAMUSCULAR | Status: DC | PRN
  Administered 2022-06-22: 30 mg via INTRAVENOUS

## 2022-06-22 MED ORDER — ONDANSETRON HCL 4 MG/2ML IJ SOLN
INTRAMUSCULAR | Status: DC | PRN
  Administered 2022-06-22: 4 mg via INTRAVENOUS

## 2022-06-22 MED ORDER — ONDANSETRON HCL 4 MG/2ML IJ SOLN
INTRAMUSCULAR | Status: AC
  Filled 2022-06-22: qty 2

## 2022-06-22 MED ORDER — MIDAZOLAM HCL 2 MG/2ML IJ SOLN
INTRAMUSCULAR | Status: DC | PRN
  Administered 2022-06-22: 2 mg via INTRAVENOUS

## 2022-06-22 MED ORDER — HYDROCODONE-ACETAMINOPHEN 5-325 MG PO TABS
1.0000 | ORAL_TABLET | ORAL | 0 refills | Status: AC | PRN
Start: 2022-06-22 — End: ?

## 2022-06-22 MED ORDER — PROPOFOL 10 MG/ML IV BOLUS
INTRAVENOUS | Status: AC
  Filled 2022-06-22: qty 20

## 2022-06-22 MED ORDER — CHLORHEXIDINE GLUCONATE 0.12 % MT SOLN
15.0000 mL | Freq: Once | OROMUCOSAL | Status: AC
  Administered 2022-06-22: 15 mL via OROMUCOSAL
  Filled 2022-06-22 (×2): qty 15

## 2022-06-22 MED ORDER — SODIUM CHLORIDE 0.9 % IR SOLN
Status: DC | PRN
  Administered 2022-06-22: 1000 mL

## 2022-06-22 MED ORDER — CEFAZOLIN SODIUM-DEXTROSE 2-4 GM/100ML-% IV SOLN
2.0000 g | INTRAVENOUS | Status: AC
  Administered 2022-06-22: 2 g via INTRAVENOUS
  Filled 2022-06-22 (×2): qty 100

## 2022-06-22 MED ORDER — DEXAMETHASONE SODIUM PHOSPHATE 10 MG/ML IJ SOLN
INTRAMUSCULAR | Status: AC
  Filled 2022-06-22: qty 1

## 2022-06-22 MED ORDER — LIDOCAINE 2% (20 MG/ML) 5 ML SYRINGE
INTRAMUSCULAR | Status: DC | PRN
  Administered 2022-06-22: 100 mg via INTRAVENOUS

## 2022-06-22 MED ORDER — KETOROLAC TROMETHAMINE 30 MG/ML IJ SOLN
INTRAMUSCULAR | Status: AC
  Filled 2022-06-22: qty 1

## 2022-06-22 MED ORDER — FENTANYL CITRATE (PF) 250 MCG/5ML IJ SOLN
INTRAMUSCULAR | Status: DC | PRN
  Administered 2022-06-22 (×4): 50 ug via INTRAVENOUS

## 2022-06-22 MED ORDER — LACTATED RINGERS IV SOLN
INTRAVENOUS | Status: DC
  Administered 2022-06-22: 1000 mL via INTRAVENOUS

## 2022-06-22 MED ORDER — LIDOCAINE HCL (PF) 2 % IJ SOLN
INTRAMUSCULAR | Status: AC
  Filled 2022-06-22: qty 5

## 2022-06-22 SURGICAL SUPPLY — 33 items
ADH SKN CLS APL DERMABOND .7 (GAUZE/BANDAGES/DRESSINGS) ×1
APL PRP STRL LF DISP 70% ISPRP (MISCELLANEOUS) ×1
BLADE SURG SZ11 CARB STEEL (BLADE) ×2 IMPLANT
CHLORAPREP W/TINT 26 (MISCELLANEOUS) ×2 IMPLANT
CLOTH BEACON ORANGE TIMEOUT ST (SAFETY) ×2 IMPLANT
COVER LIGHT HANDLE STERIS (MISCELLANEOUS) ×4 IMPLANT
DERMABOND ADVANCED (GAUZE/BANDAGES/DRESSINGS) ×1
DERMABOND ADVANCED .7 DNX12 (GAUZE/BANDAGES/DRESSINGS) ×1 IMPLANT
ELECT REM PT RETURN 9FT ADLT (ELECTROSURGICAL) ×2
ELECTRODE REM PT RTRN 9FT ADLT (ELECTROSURGICAL) ×1 IMPLANT
GLOVE BIO SURGEON STRL SZ 6.5 (GLOVE) ×1 IMPLANT
GLOVE BIOGEL PI IND STRL 7.0 (GLOVE) ×2 IMPLANT
GLOVE BIOGEL PI INDICATOR 7.0 (GLOVE) ×3
GLOVE SURG SS PI 6.5 STRL IVOR (GLOVE) ×1 IMPLANT
GLOVE SURG SS PI 7.5 STRL IVOR (GLOVE) ×2 IMPLANT
GOWN STRL REUS W/TWL LRG LVL3 (GOWN DISPOSABLE) ×6 IMPLANT
INST SET MINOR GENERAL (KITS) ×2 IMPLANT
KIT TURNOVER KIT A (KITS) ×2 IMPLANT
MANIFOLD NEPTUNE II (INSTRUMENTS) ×2 IMPLANT
MESH VENTRALEX ST 2.5 CRC MED (Mesh General) ×1 IMPLANT
NDL HYPO 21X1.5 SAFETY (NEEDLE) ×1 IMPLANT
NEEDLE HYPO 21X1.5 SAFETY (NEEDLE) ×2 IMPLANT
NS IRRIG 1000ML POUR BTL (IV SOLUTION) ×2 IMPLANT
PACK MINOR (CUSTOM PROCEDURE TRAY) ×2 IMPLANT
PAD ARMBOARD 7.5X6 YLW CONV (MISCELLANEOUS) ×2 IMPLANT
SET BASIN LINEN APH (SET/KITS/TRAYS/PACK) ×2 IMPLANT
SUT ETHIBOND NAB MO 7 #0 18IN (SUTURE) ×2 IMPLANT
SUT MNCRL AB 4-0 PS2 18 (SUTURE) ×2 IMPLANT
SUT VIC AB 2-0 CT2 27 (SUTURE) ×2 IMPLANT
SUT VIC AB 3-0 SH 27 (SUTURE) ×2
SUT VIC AB 3-0 SH 27X BRD (SUTURE) ×1 IMPLANT
SUT VICRYL AB 2 0 TIES (SUTURE) ×1 IMPLANT
SYR 20ML LL LF (SYRINGE) ×3 IMPLANT

## 2022-06-22 NOTE — Op Note (Signed)
Patient:  Charles Brandt  DOB:  1956-11-03  MRN:  [id number]   Preop Diagnosis: Umbilical hernia  Postop Diagnosis: Same  Procedure: Umbilical herniorrhaphy with mesh  Surgeon: Franky Macho, MD  Anes: General endotracheal  Indications: Patient is a 66 year old white male who presents with a reducible umbilical hernia.  The risks and benefits of the procedure including bleeding, infection, mesh use, and the possibility of recurrence of the hernia were fully explained to the patient, who gave informed consent.  Procedure note: The patient was placed in supine position.  After induction of general endotracheal anesthesia, the abdomen was prepped and draped using the usual sterile technique with ChloraPrep.  Surgical site confirmation was performed.  An infraumbilical incision was made down to the fascia.  The umbilicus was freed away from the underlying hernia sac.  The hernia sac was excised down to the fascia.  There was some omentum that was attached to the abdominal wall.  This was excised using a 2-0 Vicryl tie.  I inspected the anterior abdominal wall and no other palpable hernias were noted.  Care was taken to avoid the small bowel.  The resultant defect was approximately 4 cm in its greatest diameter.  A 6.4 cm Bard Ventralax ST patch was then inserted and secured to the fascia using 0 Ethibond interrupted sutures.  The overlying fascia was reapproximated transversely using 0 Ethibond interrupted sutures.  Excess umbilical skin was excised.  The base of the umbilicus was secured back to the fascia using a 2-0 Vicryl interrupted suture.  The subcutaneous layer was reapproximated using 3-0 Vicryl interrupted sutures.  Exparel was instilled into the surrounding wound.  The skin was closed using a 4-0 Monocryl subcuticular suture.  Dermabond was applied.  All tape and needle counts were correct at the end of the procedure.  The patient was extubated in the operating room and transferred to PACU in  stable condition.  Complications: None  EBL: Minimal  Specimen: None

## 2022-06-22 NOTE — Transfer of Care (Signed)
Immediate Anesthesia Transfer of Care Note  Patient: Charles Brandt  Procedure(s) Performed: HERNIA REPAIR UMBILICAL ADULT (Abdomen)  Patient Location: PACU  Anesthesia Type:General  Level of Consciousness: drowsy  Airway & Oxygen Therapy: Patient Spontanous Breathing and Patient connected to face mask oxygen  Post-op Assessment: Report given to RN and Post -op Vital signs reviewed and stable  Post vital signs: Reviewed and stable  Last Vitals:  Vitals Value Taken Time  BP    Temp    Pulse 66 06/22/22 0836  Resp 15 06/22/22 0836  SpO2 100 % 06/22/22 0836  Vitals shown include unvalidated device data.  Last Pain:  Vitals:   06/22/22 0658  TempSrc:   PainSc: 0-No pain      Patients Stated Pain Goal: 8 (06/22/22 6294)  Complications: No notable events documented.

## 2022-06-22 NOTE — Anesthesia Preprocedure Evaluation (Addendum)
Anesthesia Evaluation  Patient identified by MRN, date of birth, ID band Patient awake    Reviewed: Allergy & Precautions, NPO status , Patient's Chart, lab work & pertinent test results  Airway Mallampati: II  TM Distance: >3 FB Neck ROM: Full    Dental  (+) Dental Advisory Given, Teeth Intact, Caps Crowns :   Pulmonary neg pulmonary ROS,    Pulmonary exam normal breath sounds clear to auscultation       Cardiovascular Exercise Tolerance: Good hypertension, Pt. on medications Normal cardiovascular exam Rhythm:Regular Rate:Normal     Neuro/Psych negative neurological ROS  negative psych ROS   GI/Hepatic negative GI ROS, Neg liver ROS,   Endo/Other  diabetes, Well Controlled, Type 2, Oral Hypoglycemic Agents  Renal/GU negative Renal ROS  negative genitourinary   Musculoskeletal Fracture sacrum, multiple rib fx   Abdominal   Peds negative pediatric ROS (+)  Hematology negative hematology ROS (+)   Anesthesia Other Findings   Reproductive/Obstetrics negative OB ROS                             Anesthesia Physical Anesthesia Plan  ASA: 2  Anesthesia Plan: General   Post-op Pain Management: Dilaudid IV   Induction: Intravenous  PONV Risk Score and Plan: 3 and Ondansetron and Dexamethasone  Airway Management Planned: Oral ETT  Additional Equipment:   Intra-op Plan:   Post-operative Plan: Extubation in OR  Informed Consent: I have reviewed the patients History and Physical, chart, labs and discussed the procedure including the risks, benefits and alternatives for the proposed anesthesia with the patient or authorized representative who has indicated his/her understanding and acceptance.     Dental advisory given  Plan Discussed with: CRNA and Surgeon  Anesthesia Plan Comments:        Anesthesia Quick Evaluation

## 2022-06-22 NOTE — Anesthesia Procedure Notes (Addendum)
Procedure Name: Intubation Date/Time: 06/22/2022 7:32 AM  Performed by: Julian Reil, CRNAPre-anesthesia Checklist: Emergency Drugs available, Patient identified, Suction available and Patient being monitored Patient Re-evaluated:Patient Re-evaluated prior to induction Oxygen Delivery Method: Circle system utilized Preoxygenation: Pre-oxygenation with 100% oxygen Induction Type: IV induction Ventilation: Mask ventilation without difficulty and Oral airway inserted - appropriate to patient size Laryngoscope Size: Glidescope and 3 Grade View: Grade I Tube type: Oral Tube size: 7.5 mm Number of attempts: 2 Airway Equipment and Method: Rigid stylet and Video-laryngoscopy Placement Confirmation: ETT inserted through vocal cords under direct vision, positive ETCO2 and breath sounds checked- equal and bilateral Secured at: 23 cm Tube secured with: Tape Dental Injury: Teeth and Oropharynx as per pre-operative assessment and Injury to lip  Difficulty Due To: Difficult Airway- due to reduced neck mobility Comments: Noted limited neck extension, ROM with 1st DL, Miller 3, grade 4 view, large epiglottis.  2nd DL with Glidescope as noted above.  Scant abrasion to lower lip noted after 2nd DL, pressure applied with 4x4s as bite block.

## 2022-06-22 NOTE — Anesthesia Postprocedure Evaluation (Signed)
Anesthesia Post Note  Patient: Charles Brandt  Procedure(s) Performed: HERNIA REPAIR UMBILICAL ADULT (Abdomen)  Patient location during evaluation: Phase II Anesthesia Type: General Level of consciousness: awake and alert and oriented Pain management: pain level controlled Vital Signs Assessment: post-procedure vital signs reviewed and stable Respiratory status: spontaneous breathing, nonlabored ventilation and respiratory function stable Cardiovascular status: blood pressure returned to baseline and stable Postop Assessment: no apparent nausea or vomiting Anesthetic complications: no   No notable events documented.   Last Vitals:  Vitals:   06/22/22 0900 06/22/22 0919  BP: 111/63 (!) 165/85  Pulse: 65 61  Resp: 14 16  Temp:  36.6 C  SpO2: 94% 97%    Last Pain:  Vitals:   06/22/22 0919  TempSrc: Oral  PainSc: 3                  Janiyha Montufar C Janiah Devinney

## 2022-06-22 NOTE — Interval H&P Note (Signed)
History and Physical Interval Note:  06/22/2022 7:13 AM  Charles Brandt  has presented today for surgery, with the diagnosis of Umbilical hernia.  The various methods of treatment have been discussed with the patient and family. After consideration of risks, benefits and other options for treatment, the patient has consented to  Procedure(s): HERNIA REPAIR UMBILICAL ADULT (N/A) as a surgical intervention.  The patient's history has been reviewed, patient examined, no change in status, stable for surgery.  I have reviewed the patient's chart and labs.  Questions were answered to the patient's satisfaction.     Franky Macho

## 2022-06-25 ENCOUNTER — Encounter (HOSPITAL_COMMUNITY): Payer: Self-pay | Admitting: General Surgery

## 2022-07-06 ENCOUNTER — Encounter: Payer: Self-pay | Admitting: General Surgery

## 2022-07-06 ENCOUNTER — Ambulatory Visit (INDEPENDENT_AMBULATORY_CARE_PROVIDER_SITE_OTHER): Payer: Medicare Other | Admitting: General Surgery

## 2022-07-06 DIAGNOSIS — Z09 Encounter for follow-up examination after completed treatment for conditions other than malignant neoplasm: Secondary | ICD-10-CM

## 2022-07-06 NOTE — Progress Notes (Signed)
Subjective:     Charles Brandt  Virtual telephone postoperative visit performed with patient.  Patient states he is doing well.  His incision has healed.  He occasionally has little twinges around the umbilicus.  He denies any fever or chills. Objective:    There were no vitals taken for this visit.  General:  alert and cooperative       Assessment:    Doing well postoperatively.    Plan:   I told the patient that it was normal to have occasional muscle twinging around the umbilicus.  He states it is not significant.  He has resumed normal activity.  I told him to call me should any problems arise.  Follow-up as needed.  As this was a part of the global surgical fee, this was not a billable visit.  Total telephone time was 2-1/2 minutes.

## 2024-02-18 ENCOUNTER — Other Ambulatory Visit (HOSPITAL_COMMUNITY): Payer: Self-pay | Admitting: Nurse Practitioner

## 2024-02-18 DIAGNOSIS — Z136 Encounter for screening for cardiovascular disorders: Secondary | ICD-10-CM

## 2024-02-27 ENCOUNTER — Ambulatory Visit (HOSPITAL_COMMUNITY)
Admission: RE | Admit: 2024-02-27 | Discharge: 2024-02-27 | Disposition: A | Discharge status: Home / Self Care | Payer: Self-pay | Source: Ambulatory Visit | Attending: Nurse Practitioner | Admitting: Nurse Practitioner

## 2024-02-27 DIAGNOSIS — Z136 Encounter for screening for cardiovascular disorders: Secondary | ICD-10-CM | POA: Insufficient documentation
